# Patient Record
Sex: Female | Born: 2000 | Race: Black or African American | Hispanic: No | Marital: Single | Smoking: Never smoker
Health system: Southern US, Community
[De-identification: ages and names within clinical notes are randomized; demographics above are authoritative.]

## PROBLEM LIST (undated history)

## (undated) DIAGNOSIS — L509 Urticaria, unspecified: Secondary | ICD-10-CM

## (undated) DIAGNOSIS — L309 Dermatitis, unspecified: Secondary | ICD-10-CM

## (undated) DIAGNOSIS — R0602 Shortness of breath: Secondary | ICD-10-CM

## (undated) DIAGNOSIS — J45909 Unspecified asthma, uncomplicated: Secondary | ICD-10-CM

## (undated) HISTORY — DX: Shortness of breath: R06.02

## (undated) HISTORY — DX: Dermatitis, unspecified: L30.9

## (undated) HISTORY — DX: Urticaria, unspecified: L50.9

---
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
## 2019-01-15 ENCOUNTER — Other Ambulatory Visit: Payer: Self-pay

## 2019-01-15 ENCOUNTER — Encounter: Payer: Self-pay | Admitting: Allergy

## 2019-01-15 ENCOUNTER — Ambulatory Visit (INDEPENDENT_AMBULATORY_CARE_PROVIDER_SITE_OTHER): Payer: Medicaid Other | Admitting: Allergy

## 2019-01-15 VITALS — BP 127/78 | HR 94 | Temp 98.5°F | Resp 18 | Ht 64.0 in | Wt 309.6 lb

## 2019-01-15 DIAGNOSIS — J3089 Other allergic rhinitis: Secondary | ICD-10-CM | POA: Diagnosis not present

## 2019-01-15 DIAGNOSIS — T7800XD Anaphylactic reaction due to unspecified food, subsequent encounter: Secondary | ICD-10-CM | POA: Diagnosis not present

## 2019-01-15 DIAGNOSIS — J4520 Mild intermittent asthma, uncomplicated: Secondary | ICD-10-CM | POA: Diagnosis not present

## 2019-01-15 MED ORDER — MONTELUKAST SODIUM 10 MG PO TABS: 10.0000 mg | ORAL_TABLET | Freq: Every day | ORAL | 5 refills | Status: DC

## 2019-01-15 MED ORDER — ALBUTEROL SULFATE (2.5 MG/3ML) 0.083% IN NEBU: 2.5000 mg | INHALATION_SOLUTION | RESPIRATORY_TRACT | 1 refills | Status: DC | PRN

## 2019-01-15 MED FILL — ALBUTEROL 0.083 MG/ML SOLN: (2.5 MG/3ML | 10 days supply | Qty: 90 | Fill #0

## 2019-01-15 NOTE — Patient Instructions (Addendum)
Food allergy    - continue avoidance of fish    - have access to self-injectable epinephrine (Epipen or AuviQ) 0.3mg  at all times    - follow emergency action plan in case of allergic reaction  Perennial allergic rhinitis  Continue allergen avoidance measures and continue to wear mask while during yardwork  Take Allegra 180mg  daily (this replaces claritin)  If you have any nasal congestion or drainage use Flonase, Nasacort or Rhinocort 2 sprays each nostril daily for 1-2 weeks at a time  Continue use of nasal saline spray to help clean out the nose and keep moisturized  Consider allergen immunotherapy (allergy shots) if medication management is not effective in controlling allergy symptoms  Mild intermittent asthma - have access to albuterol inhaler 2 puffs every 4-6 hours as needed for cough/wheeze/shortness of breath/chest tightness.  May use 15-20 minutes prior to activity.   Monitor frequency of use.   - if you are not meeting below goals then start Singulair 10mg  daily in evenings  Asthma control goals:   Full participation in all desired activities (may need albuterol before activity)  Albuterol use two time or less a week on average (not counting use with activity)  Cough interfering with sleep two time or less a month  Oral steroids no more than once a year  No hospitalizations  Follow-up 4-6 months or sooner if needed

## 2019-01-15 NOTE — Progress Notes (Signed)
Follow-up Note  RE: SIBYLLA SOLEM MRN: [id number] DOB: 03-Mar-2001 Date of Office Visit: 01/15/2019   History of present illness: Jeanette Hernandez is a 18 y.o. female presenting today recent allergy symptom flare.  She has a history of allergic rhinitis, mild intermittent asthma and food allergy.  She was last seen in the office on November 05, 2017 by Dr. Nunzio Cobbs.  She states about a week ago she was working outside with her mother for about 20 to 30 minutes and was wearing a facemask and after she was done with the yard work she developed symptoms of coughing and sneezing.  She states the next day she had shortness of breath and was hoarse.  She states she has been taking a generic Claritin at night and then using Alka-Seltzer during the day.  She states she did use her albuterol inhaler 3 days ago with relief of symptoms.  She denies any nighttime awakenings.  She states the last time she needed to use her albuterol was about a year or so ago.  She states her allergy symptoms were not this bad last spring.  She states she used to take Singulair but she has been off of it since she was in middle school or late high school and she states she stopped as she ran out and never got the medication refilled again. She continues to avoid fish and has access to an epinephrine device.  Review of systems: Review of Systems  Constitutional: Negative for chills, fever and malaise/fatigue.  HENT: Negative for congestion, ear discharge, ear pain, nosebleeds, sinus pain and sore throat.   Eyes: Negative for pain, discharge and redness.  Respiratory: Positive for cough and shortness of breath. Negative for sputum production and wheezing.   Cardiovascular: Negative for chest pain.  Gastrointestinal: Negative for abdominal pain, constipation, diarrhea, heartburn, nausea and vomiting.  Musculoskeletal: Negative for joint pain.  Skin: Negative for itching and rash.  Neurological: Negative for headaches.     All other systems negative unless noted above in HPI  Past medical/social/surgical/family history have been reviewed and are unchanged unless specifically indicated below.  No changes  Medication List: Allergies as of 01/15/2019      Reactions   Fish Allergy Anaphylaxis   Amoxicillin       Medication List       Accurate as of January 15, 2019  3:47 PM. Always use your most recent med list.        albuterol 108 (90 Base) MCG/ACT inhaler Commonly known as:  ProAir HFA Inhale 2 puffs into the lungs every 6 (six) hours as needed for wheezing or shortness of breath.   albuterol (2.5 MG/3ML) 0.083% nebulizer solution Commonly known as:  PROVENTIL Take 3 mLs (2.5 mg total) by nebulization every 4 (four) hours as needed for wheezing or shortness of breath.   [facility] Allergy 25 mg capsule Generic drug:  diphenhydrAMINE TAKE 1 CAPSULE BY MOUTH EVERY 6 HOURS FOR 4 DAYS   EPINEPHrine 0.3 mg/0.3 mL Soaj injection Commonly known as:  EpiPen 2-Pak Inject 0.3 mLs (0.3 mg total) into the muscle once.   EQUATE PO Take by mouth.   famotidine 20 MG tablet Commonly known as:  PEPCID Take 1 tablet (20 mg total) by mouth 2 (two) times daily for 4 days.   fexofenadine 180 MG tablet Commonly known as:  ALLEGRA Take 180 mg by mouth every other day.   fluticasone 50 MCG/ACT nasal spray Commonly known as:  Flonase One  spray per nostril 1-2 times daily as needed   ibuprofen 800 MG tablet Commonly known as:  ADVIL,MOTRIN Take 1 tablet (800 mg total) by mouth 3 (three) times daily.   levocetirizine 5 MG tablet Commonly known as:  XYZAL Take 1 tablet (5 mg total) by mouth every evening.   mometasone 50 MCG/ACT nasal spray Commonly known as:  NASONEX Place 2 sprays into the nose daily.   montelukast 10 MG tablet Commonly known as:  SINGULAIR Take 1 tablet (10 mg total) by mouth at bedtime.   MULTIVITAMIN PO Take by mouth.       Known medication allergies: Allergies  Allergen  Reactions  . Fish Allergy Anaphylaxis  . Amoxicillin      Physical examination: Blood pressure 127/78, pulse 94, temperature 98.5 F (36.9 C), temperature source Tympanic, resp. rate 18, height 5\' 4"  (1.626 m), weight (!) 309 lb 9.6 oz (140.4 kg), SpO2 98 %.  General: Alert, interactive, in no acute distress. HEENT: PERRLA, TMs pearly gray, turbinates mildly edematous without discharge, post-pharynx non erythematous. Neck: Supple without lymphadenopathy. Lungs: Clear to auscultation without wheezing, rhonchi or rales. {no increased work of breathing. CV: Normal S1, S2 without murmurs. Abdomen: Nondistended, nontender. Skin: Warm and dry, without lesions or rashes. Extremities:  No clubbing, cyanosis or edema. Neuro:   Grossly intact.  Diagnositics/Labs: None today  Assessment and plan:   Food allergy    - continue avoidance of fish    - have access to self-injectable epinephrine (Epipen or AuviQ) 0.3mg  at all times    - follow emergency action plan in case of allergic reaction  Perennial allergic rhinitis  Continue allergen avoidance measures and continue to wear mask while during yardwork  Take Allegra 180mg  daily (this replaces claritin)  If you have any nasal congestion or drainage use Flonase, Nasacort or Rhinocort 2 sprays each nostril daily for 1-2 weeks at a time  Continue use of nasal saline spray to help clean out the nose and keep moisturized  Consider allergen immunotherapy (allergy shots) if medication management is not effective in controlling allergy symptoms  Mild intermittent asthma - have access to albuterol inhaler 2 puffs every 4-6 hours as needed for cough/wheeze/shortness of breath/chest tightness.  May use 15-20 minutes prior to activity.   Monitor frequency of use.   - if you are not meeting below goals then start Singulair 10mg  daily in evenings  Asthma control goals:   Full participation in all desired activities (may need albuterol before  activity)  Albuterol use two time or less a week on average (not counting use with activity)  Cough interfering with sleep two time or less a month  Oral steroids no more than once a year  No hospitalizations  Follow-up 4-6 months or sooner if needed I appreciate the opportunity to take part in [place]'s care. Please do not hesitate to contact me with questions.  Sincerely,   Margo AyeShaylar Padgett, MD Allergy/Immunology [facility]

## 2019-07-17 ENCOUNTER — Ambulatory Visit: Payer: Medicaid Other | Admitting: Allergy

## 2020-01-08 ENCOUNTER — Ambulatory Visit: Payer: Medicaid Other | Attending: Internal Medicine

## 2020-01-08 DIAGNOSIS — Z23 Encounter for immunization: Secondary | ICD-10-CM

## 2020-01-08 NOTE — Progress Notes (Signed)
   Covid-19 Vaccination Clinic  Name:  Jeanette Hernandez    MRN: [id number] DOB: Sep 12, 2001  01/08/2020  Jeanette Hernandez was observed post Covid-19 immunization for 15 minutes without incident. She was provided with Vaccine Information Sheet and instruction to access the V-Safe system.   Jeanette Hernandez was instructed to call 911 with any severe reactions post vaccine: Marland Kitchen Difficulty breathing  . Swelling of face and throat  . A fast heartbeat  . A bad rash all over body  . Dizziness and weakness   Immunizations Administered    Name Date Dose VIS Date Route   Pfizer COVID-19 Vaccine 01/08/2020  3:09 PM 0.3 mL 09/18/2019 Intramuscular   Manufacturer: [employer], [employer]   Lot: [id number]   NDC: [address]-9

## 2020-02-02 ENCOUNTER — Ambulatory Visit: Payer: Medicaid Other | Attending: Internal Medicine

## 2020-02-02 DIAGNOSIS — Z23 Encounter for immunization: Secondary | ICD-10-CM

## 2020-02-02 NOTE — Progress Notes (Signed)
   Covid-19 Vaccination Clinic  Name:  Jeanette Hernandez    MRN: [id number] DOB: 15-May-2001  02/02/2020  [name] was observed post Covid-19 immunization for 30 minutes based on pre-vaccination screening without incident. She was provided with Vaccine Information Sheet and instruction to access the V-Safe system.   [name] was instructed to call 911 with any severe reactions post vaccine: Marland Kitchen Difficulty breathing  . Swelling of face and throat  . A fast heartbeat  . A bad rash all over body  . Dizziness and weakness   Immunizations Administered    Name Date Dose VIS Date Route   Pfizer COVID-19 Vaccine 02/02/2020  4:38 PM 0.3 mL 12/02/2018 Intramuscular   Manufacturer: [employer], [employer]   Lot: [id number]   NDC: [address]-9

## 2020-02-03 ENCOUNTER — Encounter: Payer: Self-pay | Admitting: Allergy

## 2020-02-03 ENCOUNTER — Other Ambulatory Visit: Payer: Self-pay

## 2020-02-03 ENCOUNTER — Ambulatory Visit (INDEPENDENT_AMBULATORY_CARE_PROVIDER_SITE_OTHER): Payer: Medicaid Other | Admitting: Allergy

## 2020-02-03 VITALS — BP 122/76 | HR 113 | Temp 98.1°F | Resp 18 | Ht 64.0 in

## 2020-02-03 DIAGNOSIS — J3089 Other allergic rhinitis: Secondary | ICD-10-CM | POA: Diagnosis not present

## 2020-02-03 DIAGNOSIS — J4520 Mild intermittent asthma, uncomplicated: Secondary | ICD-10-CM | POA: Diagnosis not present

## 2020-02-03 DIAGNOSIS — T7800XD Anaphylactic reaction due to unspecified food, subsequent encounter: Secondary | ICD-10-CM | POA: Diagnosis not present

## 2020-02-03 MED ORDER — MONTELUKAST SODIUM 10 MG PO TABS: 10.0000 mg | ORAL_TABLET | Freq: Every day | ORAL | 5 refills | Status: DC

## 2020-02-03 MED ORDER — TRIAMCINOLONE ACETONIDE 55 MCG/ACT NA AERO: 2.0000 | INHALATION_SPRAY | Freq: Every day | NASAL | 5 refills | Status: DC

## 2020-02-03 MED ORDER — FEXOFENADINE HCL 180 MG PO TABS: 180.0000 mg | ORAL_TABLET | Freq: Every day | ORAL | 5 refills | Status: DC

## 2020-02-03 MED FILL — MONTELUKAST SOD 10 MG TAB: 10 | 30 days supply | Qty: 30 | Fill #0

## 2020-02-03 NOTE — Progress Notes (Signed)
Follow-up Note  RE: Jeanette Hernandez MRN: [id number] DOB: 2001/02/27 Date of Office Visit: 02/03/2020   History of present illness: Jeanette Hernandez is a 19 y.o. female presenting today for follow-up of food allergy, allergic rhinitis and asthma.  She was last seen in the office on January 15, 2019 by myself.  She states over the past year she has done well without any major health changes, surgeries, hospitalizations or Covid illness. She states with the onset of tree pollen season she has been waking up with more nasal congestion and needing to clear her throat as well as some chest tightness.  She states she has been taking Alka-Seltzer tabs in the morning as well as her morning vitamins.  She may take a Claritin in the day if needed.  She will normally take her Allegra which is her main antihistamine at night.  She states she has not used any nasal sprays at this time. With the chest tightness that she is feeling she states she has not needed to use her albuterol every time.  She states over the past year she has used albuterol maybe 2-3 times.  She is currently not taking Singulair.  She denies any nighttime awakenings.  She has not required any ED or urgent care visits or any systemic steroid needs. She continues the avoidance of fish and has not needed to use her epinephrine device.  Review of systems: Review of Systems  Constitutional: Negative.   HENT:       See HPI  Eyes: Negative.   Respiratory:       See HPI  Cardiovascular: Negative.   Gastrointestinal: Negative.   Musculoskeletal: Negative.   Skin: Negative.   Neurological: Negative.     All other systems negative unless noted above in HPI  Past medical/social/surgical/family history have been reviewed and are unchanged unless specifically indicated below.  No changes  Medication List: Current Outpatient Medications  Medication Sig Dispense Refill  . albuterol (PROAIR HFA) 108 (90 Base) MCG/ACT inhaler Inhale 2 puffs  into the lungs every 6 (six) hours as needed for wheezing or shortness of breath. 1 Inhaler 1  . albuterol (PROVENTIL) (2.5 MG/3ML) 0.083% nebulizer solution Take 3 mLs (2.5 mg total) by nebulization every 4 (four) hours as needed for wheezing or shortness of breath. 75 mL 1  . [facility] ALLERGY 25 MG capsule TAKE 1 CAPSULE BY MOUTH EVERY 6 HOURS FOR 4 DAYS  0  . EPINEPHrine (EPIPEN 2-PAK) 0.3 mg/0.3 mL IJ SOAJ injection Inject 0.3 mLs (0.3 mg total) into the muscle once. 4 Device 1  . fexofenadine (ALLEGRA) 180 MG tablet Take 1 tablet (180 mg total) by mouth daily. 30 tablet 5  . montelukast (SINGULAIR) 10 MG tablet Take 1 tablet (10 mg total) by mouth at bedtime. 30 tablet 5  . Multiple Vitamins-Minerals (MULTIVITAMIN PO) Take by mouth.    . Nutritional Supplements (EQUATE PO) Take by mouth.    . famotidine (PEPCID) 20 MG tablet Take 1 tablet (20 mg total) by mouth 2 (two) times daily for 4 days. 8 tablet 0  . fluticasone (FLONASE) 50 MCG/ACT nasal spray One spray per nostril 1-2 times daily as needed (Patient not taking: Reported on 01/15/2019) 16 g 5  . ibuprofen (ADVIL,MOTRIN) 800 MG tablet Take 1 tablet (800 mg total) by mouth 3 (three) times daily. (Patient not taking: Reported on 02/03/2020) 21 tablet 0  . levocetirizine (XYZAL) 5 MG tablet Take 1 tablet (5 mg total) by mouth  every evening. (Patient not taking: Reported on 01/15/2019) 30 tablet 5  . mometasone (NASONEX) 50 MCG/ACT nasal spray Place 2 sprays into the nose daily. (Patient not taking: Reported on 11/05/2017) 17 g 5  . triamcinolone (NASACORT) 55 MCG/ACT AERO nasal inhaler Place 2 sprays into the nose daily. 1 Inhaler 5   No current facility-administered medications for this visit.     Known medication allergies: Allergies  Allergen Reactions  . Fish Allergy Anaphylaxis  . Amoxicillin      Physical examination: Blood pressure 122/76, pulse (!) 113, temperature 98.1 F (36.7 C), temperature source Temporal, resp. rate 18, height  5\' 4"  (1.626 m), SpO2 97 %.  General: Alert, interactive, in no acute distress. HEENT: PERRLA, TMs pearly gray, turbinates moderately edematous without discharge, post-pharynx non erythematous. Neck: Supple without lymphadenopathy. Lungs: Clear to auscultation without wheezing, rhonchi or rales. {no increased work of breathing. CV: Normal S1, S2 without murmurs. Abdomen: Nondistended, nontender. Skin: Warm and dry, without lesions or rashes. Extremities:  No clubbing, cyanosis or edema. Neuro:   Grossly intact.  Diagnositics/Labs:  Spirometry: FEV1: 2.94L 100%, FVC: 3.63L 110%, ratio consistent with Nonobstructive pattern  Assessment and plan: Anaphylaxis due to food    - continue avoidance of fish    - have access to self-injectable epinephrine (Epipen or AuviQ) 0.3mg  at all times    - follow emergency action plan in case of allergic reaction  Allergic rhinitis  Continue allergen avoidance measures and continue to wear mask while during yardwork  Continue Allegra 180mg  daily (this replaces claritin)  Start Singulair 10mg  daily at bedtime with Allegra  For nasal congestion or drainage use Flonase, Nasacort or Rhinocort 2 sprays each nostril daily for 1-2 weeks at a time  Continue use of nasal saline spray to help clean out the nose and keep moisturized  Consider allergen immunotherapy (allergy shots) if medication management is not effective in controlling allergy symptoms  Mild intermittent asthma - have access to albuterol inhaler 2 puffs every 4-6 hours as needed for cough/wheeze/shortness of breath/chest tightness.  May use 15-20 minutes prior to activity.   Monitor frequency of use.   - Singulair will help with asthma control as well  Asthma control goals:   Full participation in all desired activities (may need albuterol before activity)  Albuterol use two time or less a week on average (not counting use with activity)  Cough interfering with sleep two time or less  a month  Oral steroids no more than once a year  No hospitalizations  Follow-up 6 months or sooner if needed  I appreciate the opportunity to take part in [facility] care. Please do not hesitate to contact me with questions.  Sincerely,   Prudy Feeler, MD Allergy/Immunology [facility] [place] [facility]

## 2020-02-03 NOTE — Patient Instructions (Addendum)
Food allergy    - continue avoidance of fish    - have access to self-injectable epinephrine (Epipen or AuviQ) 0.3mg  at all times    - follow emergency action plan in case of allergic reaction  Perennial allergic rhinitis  Continue allergen avoidance measures and continue to wear mask while during yardwork  Continue Allegra 180mg  daily (this replaces claritin)  Start Singulair 10mg  daily at bedtime with Allegra  For nasal congestion or drainage use Flonase, Nasacort or Rhinocort 2 sprays each nostril daily for 1-2 weeks at a time  Continue use of nasal saline spray to help clean out the nose and keep moisturized  Consider allergen immunotherapy (allergy shots) if medication management is not effective in controlling allergy symptoms  Mild intermittent asthma - have access to albuterol inhaler 2 puffs every 4-6 hours as needed for cough/wheeze/shortness of breath/chest tightness.  May use 15-20 minutes prior to activity.   Monitor frequency of use.   - Singulair will help with asthma control as well  Asthma control goals:   Full participation in all desired activities (may need albuterol before activity)  Albuterol use two time or less a week on average (not counting use with activity)  Cough interfering with sleep two time or less a month  Oral steroids no more than once a year  No hospitalizations  Follow-up 6 months or sooner if needed

## 2020-02-24 DIAGNOSIS — H5203 Hypermetropia, bilateral: Secondary | ICD-10-CM | POA: Diagnosis not present

## 2020-02-24 DIAGNOSIS — H1045 Other chronic allergic conjunctivitis: Secondary | ICD-10-CM | POA: Diagnosis not present

## 2020-02-24 DIAGNOSIS — H52223 Regular astigmatism, bilateral: Secondary | ICD-10-CM | POA: Diagnosis not present

## 2020-02-25 DIAGNOSIS — H5213 Myopia, bilateral: Secondary | ICD-10-CM | POA: Diagnosis not present

## 2020-04-07 DIAGNOSIS — Z419 Encounter for procedure for purposes other than remedying health state, unspecified: Secondary | ICD-10-CM | POA: Diagnosis not present

## 2020-04-08 DIAGNOSIS — H52223 Regular astigmatism, bilateral: Secondary | ICD-10-CM | POA: Diagnosis not present

## 2020-04-08 DIAGNOSIS — H5203 Hypermetropia, bilateral: Secondary | ICD-10-CM | POA: Diagnosis not present

## 2020-05-08 DIAGNOSIS — Z419 Encounter for procedure for purposes other than remedying health state, unspecified: Secondary | ICD-10-CM | POA: Diagnosis not present

## 2020-05-31 ENCOUNTER — Other Ambulatory Visit: Payer: Self-pay

## 2020-05-31 ENCOUNTER — Ambulatory Visit (HOSPITAL_COMMUNITY)
Admission: EM | Admit: 2020-05-31 | Discharge: 2020-05-31 | Disposition: A | Discharge status: Home / Self Care | Payer: Medicaid Other

## 2020-05-31 ENCOUNTER — Encounter (HOSPITAL_COMMUNITY): Payer: Self-pay | Admitting: Emergency Medicine

## 2020-05-31 DIAGNOSIS — M67431 Ganglion, right wrist: Secondary | ICD-10-CM | POA: Diagnosis not present

## 2020-05-31 NOTE — ED Triage Notes (Signed)
Pt c/o cyst on her right wrist that has progressively gotten worse. She states yesterday she had some numbness in her hand.

## 2020-05-31 NOTE — Discharge Instructions (Signed)
Use your braces  Call the orthopedic office tomorrow for follow up

## 2020-05-31 NOTE — ED Provider Notes (Signed)
[facility]    CSN: [id number] Arrival date & time: 05/31/20  1649      History   Chief Complaint Chief Complaint  Patient presents with  . Cyst    HPI Jeanette Hernandez is a 19 y.o. female.   She presents for cyst of the back of her right wrist.  She reports she had a similar cyst several years ago the underside of her wrist.  She reports this cyst started in May or June.  It is grown slightly since then.  Has become more painful.  Pain is primarily with movements and repetitive use of the wrist.  She reports occasional numbness shooting in her hand.  She has been trying braces without much luck.  She had seen a hand specialist for previous cyst and avoided surgery with splinting before.     Past Medical History:  Diagnosis Date  . Asthma   . Eczema    as a child  . Urticaria     Patient Active Problem List   Diagnosis Date Noted  . Allergic reaction 11/05/2017  . Food allergy 11/05/2017  . Perennial allergic rhinitis 11/05/2017  . Mild intermittent asthma 11/05/2017  . Allergic urticaria 11/05/2017    History reviewed. No pertinent surgical history.  OB History   No obstetric history on file.      Home Medications    Prior to Admission medications   Medication Sig Start Date End Date Taking? Authorizing Provider  albuterol (PROAIR HFA) 108 (90 Base) MCG/ACT inhaler Inhale 2 puffs into the lungs every 6 (six) hours as needed for wheezing or shortness of breath. 11/14/17  Yes Bobbitt, Heywood Iles, MD  albuterol (PROVENTIL) (2.5 MG/3ML) 0.083% nebulizer solution Take 3 mLs (2.5 mg total) by nebulization every 4 (four) hours as needed for wheezing or shortness of breath. 01/15/19  Yes Padgett, Pilar Grammes, MD  [facility] ALLERGY 25 MG capsule TAKE 1 CAPSULE BY MOUTH EVERY 6 HOURS FOR 4 DAYS 10/26/17  Yes [provider]  EPINEPHrine (EPIPEN 2-PAK) 0.3 mg/0.3 mL IJ SOAJ injection Inject 0.3 mLs (0.3 mg total) into the muscle once. 07/22/15  Yes Baxter Hire, MD  fexofenadine (ALLEGRA) 180 MG tablet Take 1 tablet (180 mg total) by mouth daily. 02/03/20  Yes Padgett, Pilar Grammes, MD  montelukast (SINGULAIR) 10 MG tablet Take 1 tablet (10 mg total) by mouth at bedtime. 02/03/20  Yes Padgett, Pilar Grammes, MD  Multiple Vitamins-Minerals (MULTIVITAMIN PO) Take by mouth.   Yes [provider]  Nutritional Supplements (EQUATE PO) Take by mouth.   Yes [provider]  triamcinolone (NASACORT) 55 MCG/ACT AERO nasal inhaler Place 2 sprays into the nose daily. 02/03/20  Yes Padgett, Pilar Grammes, MD  famotidine (PEPCID) 20 MG tablet Take 1 tablet (20 mg total) by mouth 2 (two) times daily for 4 days. 10/26/17 10/30/17  Maxwell Caul, PA-C  fluticasone (FLONASE) 50 MCG/ACT nasal spray One spray per nostril 1-2 times daily as needed Patient not taking: Reported on 01/15/2019 11/05/17   Bobbitt, Heywood Iles, MD  ibuprofen (ADVIL,MOTRIN) 800 MG tablet Take 1 tablet (800 mg total) by mouth 3 (three) times daily. Patient not taking: Reported on 02/03/2020 09/19/17   Roxy Horseman, PA-C  levocetirizine (XYZAL) 5 MG tablet Take 1 tablet (5 mg total) by mouth every evening. Patient not taking: Reported on 01/15/2019 11/05/17   Bobbitt, Heywood Iles, MD  mometasone (NASONEX) 50 MCG/ACT nasal spray Place 2 sprays into the nose daily. Patient not  taking: Reported on 11/05/2017 07/22/15   Baxter Hire, MD    Family History Family History  Problem Relation Age of Onset  . Asthma Father     Social History Social History   Tobacco Use  . Smoking status: Passive Smoke Exposure - Never Smoker  . Smokeless tobacco: Never Used  Vaping Use  . Vaping Use: Never used  Substance Use Topics  . Alcohol use: No    Alcohol/week: 0.0 standard drinks  . Drug use: No     Allergies   Fish allergy and Amoxicillin   Review of Systems Review of Systems   Physical Exam Triage Vital Signs ED Triage Vitals [05/31/20 1747]  Enc  Vitals Group     BP      Pulse      Resp      Temp      Temp src      SpO2      Weight      Height      Head Circumference      Peak Flow      Pain Score 8     Pain Loc      Pain Edu?      Excl. in GC?    No data found.  Updated Vital Signs Pulse 90   Temp 98.2 F (36.8 C) (Oral)   Resp 16   LMP 05/22/2020   SpO2 100%   Visual Acuity Right Eye Distance:   Left Eye Distance:   Bilateral Distance:    Right Eye Near:   Left Eye Near:    Bilateral Near:     Physical Exam Vitals and nursing note reviewed.  Constitutional:      Appearance: Normal appearance.  Musculoskeletal:     Comments: Right wrist with small roughly 0.75 cm cyst on the dorsal aspect of the wrist.  No overlying erythema.  Neurological:     Mental Status: She is alert.      UC Treatments / Results  Labs (all labs ordered are listed, but only abnormal results are displayed) Labs Reviewed - No data to display  EKG   Radiology No results found.  Procedures Procedures (including critical care time)  Medications Ordered in UC Medications - No data to display  Initial Impression / Assessment and Plan / UC Course  I have reviewed the triage vital signs and the nursing notes.  Pertinent labs & imaging results that were available during my care of the patient were reviewed by me and considered in my medical decision making (see chart for details).     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  Patient has braces at home, will recommend continued use of this.  We will have her follow-up with hand specialist for consultation for possible removal and further management.  Patient verbalized agreement understanding plan of care. 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   Hermelinda Medicus, PA-C 06/01/20 [phone]

## 2020-06-08 DIAGNOSIS — Z419 Encounter for procedure for purposes other than remedying health state, unspecified: Secondary | ICD-10-CM | POA: Diagnosis not present

## 2020-06-28 DIAGNOSIS — M67431 Ganglion, right wrist: Secondary | ICD-10-CM | POA: Diagnosis not present

## 2020-07-08 DIAGNOSIS — Z419 Encounter for procedure for purposes other than remedying health state, unspecified: Secondary | ICD-10-CM | POA: Diagnosis not present

## 2020-07-13 ENCOUNTER — Other Ambulatory Visit: Payer: Self-pay | Admitting: Family Medicine

## 2020-07-13 ENCOUNTER — Encounter: Payer: Self-pay | Admitting: Family Medicine

## 2020-07-13 ENCOUNTER — Ambulatory Visit: Payer: Medicaid Other | Attending: Family Medicine | Admitting: Family Medicine

## 2020-07-13 ENCOUNTER — Other Ambulatory Visit: Payer: Self-pay

## 2020-07-13 VITALS — BP 115/70 | HR 84 | Ht 64.0 in | Wt 311.0 lb

## 2020-07-13 DIAGNOSIS — Z23 Encounter for immunization: Secondary | ICD-10-CM | POA: Diagnosis not present

## 2020-07-13 DIAGNOSIS — N946 Dysmenorrhea, unspecified: Secondary | ICD-10-CM

## 2020-07-13 DIAGNOSIS — Z13228 Encounter for screening for other metabolic disorders: Secondary | ICD-10-CM | POA: Diagnosis not present

## 2020-07-13 MED ORDER — NORETHIN ACE-ETH ESTRAD-FE 1.5-30 MG-MCG PO TABS: 1.0000 | ORAL_TABLET | Freq: Every day | ORAL | 11 refills | Status: DC

## 2020-07-13 MED FILL — JUNEL FE 1.5/30 TABLET: 1.5-30 | 28 days supply | Qty: 28 | Fill #0

## 2020-07-13 NOTE — Progress Notes (Signed)
Has been having heavy period, states that she is having heavy bleeding. Having very bad cramps.  Lab work shows she is anemic and she says its due to her heavy bleeding.

## 2020-07-13 NOTE — Progress Notes (Signed)
Subjective:  Patient ID: MCKYNLIE VANDERSLICE, female    DOB: 2001/08/10  Age: 19 y.o. MRN: [id number]  CC: New Patient (Initial Visit)   HPI Jeanette Hernandez is a 19 year old female who presents to establish care.  Her Mom is on the phone during the visit. She has had heavy bleeding for the last 1 to 2 years. Attained menarche at 12 cycles have been regular. This is associated with abdominal cramping and clots. She uses Tylenol and Pamprin for her symptoms. Mom provides this information about the patient's recent labs-TSH was normal. Hgb was 11.6 from an outside lab. LMP 06/22/20 and periods last about 4 days.  Past Medical History:  Diagnosis Date  . Asthma   . Eczema    as a child  . Urticaria     No past surgical history on file.  Family History  Problem Relation Age of Onset  . Asthma Father     Allergies  Allergen Reactions  . Fish Allergy Anaphylaxis  . Amoxicillin     Outpatient Medications Prior to Visit  Medication Sig Dispense Refill  . albuterol (PROAIR HFA) 108 (90 Base) MCG/ACT inhaler Inhale 2 puffs into the lungs every 6 (six) hours as needed for wheezing or shortness of breath. 1 Inhaler 1  . albuterol (PROVENTIL) (2.5 MG/3ML) 0.083% nebulizer solution Take 3 mLs (2.5 mg total) by nebulization every 4 (four) hours as needed for wheezing or shortness of breath. 75 mL 1  . [facility] ALLERGY 25 MG capsule TAKE 1 CAPSULE BY MOUTH EVERY 6 HOURS FOR 4 DAYS  0  . EPINEPHrine (EPIPEN 2-PAK) 0.3 mg/0.3 mL IJ SOAJ injection Inject 0.3 mLs (0.3 mg total) into the muscle once. 4 Device 1  . montelukast (SINGULAIR) 10 MG tablet Take 1 tablet (10 mg total) by mouth at bedtime. 30 tablet 5  . Multiple Vitamins-Minerals (MULTIVITAMIN PO) Take by mouth.    . famotidine (PEPCID) 20 MG tablet Take 1 tablet (20 mg total) by mouth 2 (two) times daily for 4 days. 8 tablet 0  . fexofenadine (ALLEGRA) 180 MG tablet Take 1 tablet (180 mg total) by mouth daily. 30 tablet 5  . fluticasone  (FLONASE) 50 MCG/ACT nasal spray One spray per nostril 1-2 times daily as needed (Patient not taking: Reported on 01/15/2019) 16 g 5  . ibuprofen (ADVIL,MOTRIN) 800 MG tablet Take 1 tablet (800 mg total) by mouth 3 (three) times daily. (Patient not taking: Reported on 02/03/2020) 21 tablet 0  . levocetirizine (XYZAL) 5 MG tablet Take 1 tablet (5 mg total) by mouth every evening. (Patient not taking: Reported on 01/15/2019) 30 tablet 5  . mometasone (NASONEX) 50 MCG/ACT nasal spray Place 2 sprays into the nose daily. (Patient not taking: Reported on 11/05/2017) 17 g 5  . Nutritional Supplements (EQUATE PO) Take by mouth. (Patient not taking: Reported on 07/13/2020)    . triamcinolone (NASACORT) 55 MCG/ACT AERO nasal inhaler Place 2 sprays into the nose daily. (Patient not taking: Reported on 07/13/2020) 1 Inhaler 5   No facility-administered medications prior to visit.     ROS Review of Systems  Constitutional: Negative for activity change, appetite change and fatigue.  HENT: Negative for congestion, sinus pressure and sore throat.   Eyes: Negative for visual disturbance.  Respiratory: Negative for cough, chest tightness, shortness of breath and wheezing.   Cardiovascular: Negative for chest pain and palpitations.  Gastrointestinal: Negative for abdominal distention, abdominal pain and constipation.  Endocrine: Negative for polydipsia.  Genitourinary: Positive  for menstrual problem. Negative for dysuria and frequency.  Musculoskeletal: Negative for arthralgias and back pain.  Skin: Negative for rash.  Neurological: Negative for tremors, light-headedness and numbness.  Hematological: Does not bruise/bleed easily.  Psychiatric/Behavioral: Negative for agitation and behavioral problems.    Objective:  BP 115/70   Pulse 84   Ht 5\' 4"  (1.626 m)   Wt (!) 311 lb (141.1 kg)   SpO2 100%   BMI 53.38 kg/m   BP/Weight 07/13/2020 02/03/2020 01/15/2019  Systolic BP 115 122 127  Diastolic BP 70 76 78    Wt. (Lbs) 311 - 309.6  BMI 53.38 53.14 53.14      Physical Exam Constitutional:      Appearance: She is well-developed. She is obese.  Neck:     Vascular: No JVD.  Cardiovascular:     Rate and Rhythm: Normal rate.     Heart sounds: Normal heart sounds. No murmur heard.   Pulmonary:     Effort: Pulmonary effort is normal.     Breath sounds: Normal breath sounds. No wheezing or rales.  Chest:     Chest wall: No tenderness.  Abdominal:     General: Bowel sounds are normal. There is no distension.     Palpations: Abdomen is soft. There is no mass.     Tenderness: There is no abdominal tenderness.  Musculoskeletal:        General: Normal range of motion.     Right lower leg: No edema.     Left lower leg: No edema.  Neurological:     Mental Status: She is alert and oriented to person, place, and time.  Psychiatric:        Mood and Affect: Mood normal.     CMP Latest Ref Rng & Units 09/18/2017  Glucose 65 - 99 mg/dL 14/09/2017)  BUN 6 - 20 mg/dL 8  Creatinine 509(T - 2.67 mg/dL 1.24  Sodium 5.80 - 998 mmol/L 135  Potassium 3.5 - 5.1 mmol/L 3.2(L)  Chloride 101 - 111 mmol/L 104  CO2 22 - 32 mmol/L 25  Calcium 8.9 - 10.3 mg/dL 338)    Lipid Panel  No results found for: CHOL, TRIG, HDL, CHOLHDL, VLDL, LDLCALC, LDLDIRECT  CBC    Component Value Date/Time   WBC 10.9 (H) 09/18/2017 2046   RBC 4.56 09/18/2017 2046   HGB 10.5 (L) 09/18/2017 2046   HCT 33.3 (L) 09/18/2017 2046   PLT 349 09/18/2017 2046   MCV 73.0 (L) 09/18/2017 2046   MCH 23.0 (L) 09/18/2017 2046   MCHC 31.5 09/18/2017 2046   RDW 16.7 (H) 09/18/2017 2046    No results found for: HGBA1C  Assessment & Plan:  1. Menorrhalgia We will place on oral contraceptive pill-discussed risks and benefits We will need to exclude anemia and other metabolic causes Pap smear at next visit Consider pelvic ultrasound down the road - CBC with Differential/Platelet - norethindrone-ethinyl estradiol-iron (MICROGESTIN FE  1.5/30) 1.5-30 MG-MCG tablet; Take 1 tablet by mouth daily.  Dispense: 28 tablet; Refill: 11  2. Screening for metabolic disorder - Basic Metabolic Panel - TSH - Hemoglobin A1c    Meds ordered this encounter  Medications  . norethindrone-ethinyl estradiol-iron (MICROGESTIN FE 1.5/30) 1.5-30 MG-MCG tablet    Sig: Take 1 tablet by mouth daily.    Dispense:  28 tablet    Refill:  11    Follow-up: Return in about 6 weeks (around 08/25/2020) for Follow-up of menorrhagia.       Elsbeth Yearick  Alvis Lemmings, MD, FAAFP. [facility] [place], [place] [phone]   07/13/2020, 2:47 PM

## 2020-07-14 LAB — CBC WITH DIFFERENTIAL/PLATELET
Basophils Absolute: 0 10*3/uL (ref 0.0–0.2)
Basos: 0 %
EOS (ABSOLUTE): 0.2 10*3/uL (ref 0.0–0.4)
Eos: 2 %
Hematocrit: 35.3 % (ref 34.0–46.6)
Hemoglobin: 11.3 g/dL (ref 11.1–15.9)
Immature Grans (Abs): 0 10*3/uL (ref 0.0–0.1)
Immature Granulocytes: 0 %
Lymphocytes Absolute: 2.5 10*3/uL (ref 0.7–3.1)
Lymphs: 25 %
MCH: 24.1 pg — ABNORMAL LOW (ref 26.6–33.0)
MCHC: 32 g/dL (ref 31.5–35.7)
MCV: 75 fL — ABNORMAL LOW (ref 79–97)
Monocytes Absolute: 0.6 10*3/uL (ref 0.1–0.9)
Monocytes: 6 %
Neutrophils Absolute: 6.8 10*3/uL (ref 1.4–7.0)
Neutrophils: 67 %
Platelets: 418 10*3/uL (ref 150–450)
RBC: 4.69 x10E6/uL (ref 3.77–5.28)
RDW: 16 % — ABNORMAL HIGH (ref 11.7–15.4)
WBC: 10.1 10*3/uL (ref 3.4–10.8)

## 2020-07-14 LAB — BASIC METABOLIC PANEL
BUN/Creatinine Ratio: 15 (ref 9–23)
BUN: 10 mg/dL (ref 6–20)
CO2: 23 mmol/L (ref 20–29)
Calcium: 8.8 mg/dL (ref 8.7–10.2)
Chloride: 99 mmol/L (ref 96–106)
Creatinine, Ser: 0.65 mg/dL (ref 0.57–1.00)
GFR calc Af Amer: 147 mL/min/{1.73_m2} (ref >59)
GFR calc non Af Amer: 127 mL/min/{1.73_m2} (ref >59)
Glucose: 66 mg/dL (ref 65–99)
Potassium: 3.9 mmol/L (ref 3.5–5.2)
Sodium: 138 mmol/L (ref 134–144)

## 2020-07-14 LAB — HEMOGLOBIN A1C
Est. average glucose Bld gHb Est-mCnc: 111 mg/dL
Hgb A1c MFr Bld: 5.5 % (ref 4.8–5.6)

## 2020-07-14 LAB — TSH: TSH: 1.44 u[IU]/mL (ref 0.450–4.500)

## 2020-07-26 DIAGNOSIS — M67431 Ganglion, right wrist: Secondary | ICD-10-CM | POA: Diagnosis not present

## 2020-08-03 MED FILL — JUNEL FE 1.5/30 TABLET: 1.5-30 | 28 days supply | Qty: 28 | Fill #1

## 2020-08-08 DIAGNOSIS — Z419 Encounter for procedure for purposes other than remedying health state, unspecified: Secondary | ICD-10-CM | POA: Diagnosis not present

## 2020-08-27 MED FILL — JUNEL FE 1.5/30 TABLET: 1.5-30 | 28 days supply | Qty: 28 | Fill #2

## 2020-09-06 ENCOUNTER — Ambulatory Visit: Payer: Medicaid Other | Attending: Family Medicine | Admitting: Family Medicine

## 2020-09-06 ENCOUNTER — Other Ambulatory Visit (HOSPITAL_COMMUNITY)
Admission: RE | Admit: 2020-09-06 | Discharge: 2020-09-06 | Disposition: A | Discharge status: Home / Self Care | Payer: Medicaid Other | Source: Ambulatory Visit | Attending: Family Medicine | Admitting: Family Medicine

## 2020-09-06 ENCOUNTER — Other Ambulatory Visit: Payer: Self-pay

## 2020-09-06 ENCOUNTER — Encounter: Payer: Self-pay | Admitting: Family Medicine

## 2020-09-06 VITALS — BP 126/74 | HR 95 | Ht 64.0 in | Wt 318.0 lb

## 2020-09-06 DIAGNOSIS — Z113 Encounter for screening for infections with a predominantly sexual mode of transmission: Secondary | ICD-10-CM | POA: Diagnosis not present

## 2020-09-06 DIAGNOSIS — Z124 Encounter for screening for malignant neoplasm of cervix: Secondary | ICD-10-CM | POA: Diagnosis not present

## 2020-09-06 DIAGNOSIS — N939 Abnormal uterine and vaginal bleeding, unspecified: Secondary | ICD-10-CM | POA: Diagnosis not present

## 2020-09-06 NOTE — Progress Notes (Signed)
Subjective:  Patient ID: Jeanette Hernandez, female    DOB: [birth year]  Age: 19 y.o. MRN: [id number]  CC: Gynecologic Exam   HPI Jeanette Hernandez is a 19 year old female with a history of abnormal uterine bleed here for follow-up visit and a Pap smear. She started her OCP on 07/17/20 and had a period from 10/17 - 11/12 and then another cycle started 11/22 - 11/26. She has no cramping like she used to with her monthly cycles.  She is tolerating her OCP okay.  Prior to initiating her OCP a course of Provera was not prescribed.  Past Medical History:  Diagnosis Date  . Asthma   . Eczema    as a child  . Urticaria     History reviewed. No pertinent surgical history.  Family History  Problem Relation Age of Onset  . Asthma Father     Allergies  Allergen Reactions  . Fish Allergy Anaphylaxis  . Amoxicillin     Outpatient Medications Prior to Visit  Medication Sig Dispense Refill  . albuterol (PROAIR HFA) 108 (90 Base) MCG/ACT inhaler Inhale 2 puffs into the lungs every 6 (six) hours as needed for wheezing or shortness of breath. 1 Inhaler 1  . albuterol (PROVENTIL) (2.5 MG/3ML) 0.083% nebulizer solution Take 3 mLs (2.5 mg total) by nebulization every 4 (four) hours as needed for wheezing or shortness of breath. 75 mL 1  . [facility] ALLERGY 25 MG capsule TAKE 1 CAPSULE BY MOUTH EVERY 6 HOURS FOR 4 DAYS  0  . EPINEPHrine (EPIPEN 2-PAK) 0.3 mg/0.3 mL IJ SOAJ injection Inject 0.3 mLs (0.3 mg total) into the muscle once. 4 Device 1  . fexofenadine (ALLEGRA) 180 MG tablet Take 1 tablet (180 mg total) by mouth daily. 30 tablet 5  . montelukast (SINGULAIR) 10 MG tablet Take 1 tablet (10 mg total) by mouth at bedtime. 30 tablet 5  . Multiple Vitamins-Minerals (MULTIVITAMIN PO) Take by mouth.    . norethindrone-ethinyl estradiol-iron (MICROGESTIN FE 1.5/30) 1.5-30 MG-MCG tablet Take 1 tablet by mouth daily. 28 tablet 11  . famotidine (PEPCID) 20 MG tablet Take 1 tablet (20 mg total) by mouth 2 (two)  times daily for 4 days. 8 tablet 0  . fluticasone (FLONASE) 50 MCG/ACT nasal spray One spray per nostril 1-2 times daily as needed (Patient not taking: Reported on 01/15/2019) 16 g 5  . ibuprofen (ADVIL,MOTRIN) 800 MG tablet Take 1 tablet (800 mg total) by mouth 3 (three) times daily. (Patient not taking: Reported on 02/03/2020) 21 tablet 0  . levocetirizine (XYZAL) 5 MG tablet Take 1 tablet (5 mg total) by mouth every evening. (Patient not taking: Reported on 01/15/2019) 30 tablet 5  . mometasone (NASONEX) 50 MCG/ACT nasal spray Place 2 sprays into the nose daily. (Patient not taking: Reported on 11/05/2017) 17 g 5  . Nutritional Supplements (EQUATE PO) Take by mouth. (Patient not taking: Reported on 07/13/2020)    . triamcinolone (NASACORT) 55 MCG/ACT AERO nasal inhaler Place 2 sprays into the nose daily. (Patient not taking: Reported on 07/13/2020) 1 Inhaler 5   No facility-administered medications prior to visit.     ROS Review of Systems  Constitutional: Negative for activity change, appetite change and fatigue.  HENT: Negative for congestion, sinus pressure and sore throat.   Eyes: Negative for visual disturbance.  Respiratory: Negative for cough, chest tightness, shortness of breath and wheezing.   Cardiovascular: Negative for chest pain and palpitations.  Gastrointestinal: Negative for abdominal distention, abdominal pain and  constipation.  Endocrine: Negative for polydipsia.  Genitourinary: Positive for menstrual problem. Negative for dysuria and frequency.  Musculoskeletal: Negative for arthralgias and back pain.  Skin: Negative for rash.  Neurological: Negative for tremors, light-headedness and numbness.  Hematological: Does not bruise/bleed easily.  Psychiatric/Behavioral: Negative for agitation and behavioral problems.    Objective:  BP 126/74   Pulse 95   Ht 5\' 4"  (1.626 m)   Wt (!) 318 lb (144.2 kg)   SpO2 100%   BMI 54.58 kg/m   BP/Weight 09/06/2020 07/13/2020 02/03/2020    Systolic BP 126 115 122  Diastolic BP 74 70 76  Wt. (Lbs) 318 311 -  BMI 54.58 53.38 53.14      Physical Exam Constitutional:      Appearance: She is well-developed.  Neck:     Vascular: No JVD.  Cardiovascular:     Rate and Rhythm: Normal rate.     Heart sounds: Normal heart sounds. No murmur heard.   Pulmonary:     Effort: Pulmonary effort is normal.     Breath sounds: Normal breath sounds. No wheezing or rales.  Chest:     Chest wall: No tenderness.  Abdominal:     General: Bowel sounds are normal. There is no distension.     Palpations: Abdomen is soft. There is no mass.     Tenderness: There is no abdominal tenderness.  Genitourinary:    Comments: External genitalia, vagina, cervix, adnexa-normal Musculoskeletal:        General: Normal range of motion.     Right lower leg: No edema.     Left lower leg: No edema.  Neurological:     Mental Status: She is alert and oriented to person, place, and time.  Psychiatric:        Mood and Affect: Mood normal.     CMP Latest Ref Rng & Units 07/13/2020 09/18/2017  Glucose 65 - 99 mg/dL 66 14/09/2017)  BUN 6 - 20 mg/dL 10 8  Creatinine 917(H - 1.00 mg/dL 1.50 5.69  Sodium 7.94 - 144 mmol/L 138 135  Potassium 3.5 - 5.2 mmol/L 3.9 3.2(L)  Chloride 96 - 106 mmol/L 99 104  CO2 20 - 29 mmol/L 23 25  Calcium 8.7 - 10.2 mg/dL 8.8 801)     CBC    Component Value Date/Time   WBC 10.1 07/13/2020 1443   WBC 10.9 (H) 09/18/2017 2046   RBC 4.69 07/13/2020 1443   RBC 4.56 09/18/2017 2046   HGB 11.3 07/13/2020 1443   HCT 35.3 07/13/2020 1443   PLT 418 07/13/2020 1443   MCV 75 (L) 07/13/2020 1443   MCH 24.1 (L) 07/13/2020 1443   MCH 23.0 (L) 09/18/2017 2046   MCHC 32.0 07/13/2020 1443   MCHC 31.5 09/18/2017 2046   RDW 16.0 (H) 07/13/2020 1443   LYMPHSABS 2.5 07/13/2020 1443   EOSABS 0.2 07/13/2020 1443   BASOSABS 0.0 07/13/2020 1443    Lab Results  Component Value Date   HGBA1C 5.5 07/13/2020    Assessment & Plan:  1.  Abnormal uterine bleeding Currently on OCP We will send of Pap smear to exclude underlying pathology If abnormal uterine bleed persist consider different OCP with shorter hormonal free pills or an extended regimen of 3 months  2. Screening for cervical cancer - Cytology - PAP([place])  3. Screening for STD (sexually transmitted disease) - Cervicovaginal ancillary only  No orders of the defined types were placed in this encounter.   Follow-up: Return in  about 3 months (around 12/05/2020) for Abnormal uterine bleed.       Hoy Register, MD, FAAFP. [facility] [place], [place] [phone]   09/06/2020, 5:46 PM

## 2020-09-06 NOTE — Progress Notes (Signed)
Wants to discuss bleeding while on birth control.  Still having irregular periods.

## 2020-09-06 NOTE — Patient Instructions (Signed)
Abnormal Uterine Bleeding °Abnormal uterine bleeding means bleeding more than usual from your uterus. It can include: °· Bleeding between periods. °· Bleeding after sex. °· Bleeding that is heavier than normal. °· Periods that last longer than usual. °· Bleeding after you have stopped having your period (menopause). °There are many problems that may cause this. You should see a doctor for any kind of bleeding that is not normal. Treatment depends on the cause of the bleeding. °Follow these instructions at home: °· Watch your condition for any changes. °· Do not use tampons, douche, or have sex, if your doctor tells you not to. °· Change your pads often. °· Get regular well-woman exams. Make sure they include a pelvic exam and cervical cancer screening. °· Keep all follow-up visits as told by your doctor. This is important. °Contact a doctor if: °· The bleeding lasts more than one week. °· You feel dizzy at times. °· You feel like you are going to throw up (nauseous). °· You throw up. °Get help right away if: °· You pass out. °· You have to change pads every hour. °· You have belly (abdominal) pain. °· You have a fever. °· You get sweaty. °· You get weak. °· You passing large blood clots from your vagina. °Summary °· Abnormal uterine bleeding means bleeding more than usual from your uterus. °· There are many problems that may cause this. You should see a doctor for any kind of bleeding that is not normal. °· Treatment depends on the cause of the bleeding. °This information is not intended to replace advice given to you by your health care provider. Make sure you discuss any questions you have with your health care provider. °Document Revised: 09/18/2016 Document Reviewed: 09/18/2016 °Elsevier Patient Education © 2020 [employer] Inc[employer] ° °

## 2020-09-07 DIAGNOSIS — Z419 Encounter for procedure for purposes other than remedying health state, unspecified: Secondary | ICD-10-CM | POA: Diagnosis not present

## 2020-09-07 LAB — CERVICOVAGINAL ANCILLARY ONLY
Bacterial Vaginitis (gardnerella): POSITIVE — AB
Candida Glabrata: NEGATIVE
Candida Vaginitis: NEGATIVE
Chlamydia: NEGATIVE
Comment: NEGATIVE
Comment: NEGATIVE
Comment: NEGATIVE
Comment: NEGATIVE
Comment: NEGATIVE
Comment: NORMAL
Neisseria Gonorrhea: NEGATIVE
Trichomonas: NEGATIVE

## 2020-09-09 LAB — CYTOLOGY - PAP: Diagnosis: NEGATIVE

## 2020-09-12 ENCOUNTER — Other Ambulatory Visit: Payer: Self-pay | Admitting: Family Medicine

## 2020-09-12 ENCOUNTER — Other Ambulatory Visit: Payer: Self-pay

## 2020-09-12 MED ORDER — METRONIDAZOLE 0.75 % VA GEL: 1.0000 | Freq: Every day | VAGINAL | 0 refills | Status: DC

## 2020-09-22 MED FILL — metroNIDAZOLE 0.75 % GEL: 0.75 | 5 days supply | Qty: 70 | Fill #0

## 2020-09-26 ENCOUNTER — Emergency Department (HOSPITAL_COMMUNITY): Payer: 59

## 2020-09-26 ENCOUNTER — Other Ambulatory Visit: Payer: Self-pay

## 2020-09-26 ENCOUNTER — Emergency Department (HOSPITAL_COMMUNITY)
Admission: EM | Admit: 2020-09-26 | Discharge: 2020-09-26 | Disposition: A | Discharge status: Home / Self Care | Payer: 59 | Attending: Emergency Medicine | Admitting: Emergency Medicine

## 2020-09-26 ENCOUNTER — Encounter (HOSPITAL_COMMUNITY): Payer: Self-pay

## 2020-09-26 ENCOUNTER — Other Ambulatory Visit (HOSPITAL_COMMUNITY): Payer: Self-pay | Admitting: Emergency Medicine

## 2020-09-26 DIAGNOSIS — M5459 Other low back pain: Secondary | ICD-10-CM | POA: Diagnosis not present

## 2020-09-26 DIAGNOSIS — J45909 Unspecified asthma, uncomplicated: Secondary | ICD-10-CM | POA: Diagnosis not present

## 2020-09-26 DIAGNOSIS — M545 Low back pain, unspecified: Secondary | ICD-10-CM | POA: Insufficient documentation

## 2020-09-26 DIAGNOSIS — M542 Cervicalgia: Secondary | ICD-10-CM | POA: Diagnosis not present

## 2020-09-26 DIAGNOSIS — Z041 Encounter for examination and observation following transport accident: Secondary | ICD-10-CM | POA: Diagnosis not present

## 2020-09-26 DIAGNOSIS — Z7722 Contact with and (suspected) exposure to environmental tobacco smoke (acute) (chronic): Secondary | ICD-10-CM | POA: Insufficient documentation

## 2020-09-26 DIAGNOSIS — Y92410 Unspecified street and highway as the place of occurrence of the external cause: Secondary | ICD-10-CM | POA: Insufficient documentation

## 2020-09-26 MED ORDER — IBUPROFEN 200 MG PO TABS
600.0000 mg | ORAL_TABLET | Freq: Once | ORAL | Status: DC
  Filled 2020-09-26: qty 3

## 2020-09-26 MED ORDER — CYCLOBENZAPRINE HCL 10 MG PO TABS: 10.0000 mg | ORAL_TABLET | Freq: Two times a day (BID) | ORAL | 0 refills | Status: DC | PRN

## 2020-09-26 MED ORDER — ACETAMINOPHEN 500 MG PO TABS
1000.0000 mg | ORAL_TABLET | Freq: Once | ORAL | Status: AC
  Administered 2020-09-26: 02:00:00 1000 mg via ORAL
  Filled 2020-09-26: qty 2

## 2020-09-26 MED ORDER — METHOCARBAMOL 500 MG PO TABS
1000.0000 mg | ORAL_TABLET | Freq: Once | ORAL | Status: AC
  Administered 2020-09-26: 02:00:00 1000 mg via ORAL
  Filled 2020-09-26: qty 2

## 2020-09-26 MED FILL — JUNEL FE 1.5/30 TABLET: 1.5-30 | 28 days supply | Qty: 28 | Fill #3

## 2020-09-26 MED FILL — CYCLOBENZAPRINE HCL 10 MG T: 10 | 10 days supply | Qty: 20 | Fill #0
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
## 2020-09-26 NOTE — Discharge Instructions (Signed)
You were evaluated in the Emergency Department and after careful evaluation, we did not find any emergent condition requiring admission or further testing in the hospital.  Your exam/testing today was overall reassuring.  X-rays did not show any broken bones.  Your symptoms seem to be due to muscle strain or spasm.  Please use Tylenol 1000 mg every 4-6 hours and/or Motrin 600 mg every 4-6 hours for pain.  You can use the Flexeril muscle relaxer provided for more significant pain.  Please return to the Emergency Department if you experience any worsening of your condition.  Thank you for allowing [place] to be a part of your care.
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
## 2020-10-08 DIAGNOSIS — Z419 Encounter for procedure for purposes other than remedying health state, unspecified: Secondary | ICD-10-CM | POA: Diagnosis not present

## 2020-10-25 MED FILL — JUNEL FE 1.5/30 TABLET: 1.5-30 | 28 days supply | Qty: 28 | Fill #4

## 2020-11-08 DIAGNOSIS — Z419 Encounter for procedure for purposes other than remedying health state, unspecified: Secondary | ICD-10-CM | POA: Diagnosis not present

## 2020-11-25 ENCOUNTER — Telehealth: Payer: Self-pay | Admitting: Family Medicine

## 2020-11-25 ENCOUNTER — Ambulatory Visit: Payer: Self-pay | Admitting: *Deleted

## 2020-11-25 MED FILL — BLISOVI FE 1.5/30 1.5-30 MG: 1.5-30 | 28 days supply | Qty: 28 | Fill #5

## 2020-11-25 NOTE — Telephone Encounter (Signed)
Spoke with Arlys John, Pharmacist and he says they would like to change her to another generic lo-estrin because it is no longer in stock; he reiterates that the substitution is the same medication but has a different manufacturer; pt notified and verbalized understanding; she would lile to pick up this medication; the pt is seen at [facility]; will route to office for notification.

## 2020-11-25 NOTE — Telephone Encounter (Signed)
I do not have enough information to work with. Can we please obtain details of what she wants from the patient? Thanks.

## 2020-11-25 NOTE — Telephone Encounter (Signed)
Pt states that she spoke with a triage nurses and all questions were answered.

## 2020-11-25 NOTE — Telephone Encounter (Signed)
Per intitial encounter, "Pt called stating that the pharmacy is not able to continue giving her the [facility] she has been taking due to not having any in stock. She states that they have offered her another brand, but she is not sure whether it is okay to take it. She is requesting to have a call back to discuss this further. Please advise."; contacted pt to discuss; she was taking Junel; the pharmacy says they no longer order this medication and would like to give her a substitution; she would like to talk with her PCP regarding this switch; the pt is also concerned because she is to start a new pack of pills on 11/27/20; will contact pharmacy for more information.

## 2020-11-25 NOTE — Telephone Encounter (Signed)
  Reason for Disposition . Caller has medicine question only, adult not sick, AND triager answers question  Answer Assessment - Initial Assessment Questions 1. NAME of MEDICATION: "What medicine are you calling about?"     Jeanette Hernandez 2. QUESTION: "What is your question?" (e.g., medication refill, side effect)     Can pharmacy switch med 3. PRESCRIBING HCP: "Who prescribed it?" Reason: if prescribed by specialist, call should be referred to that group.    Dr Alvis Lemmings 4. SYMPTOMS: "Do you have any symptoms?"     none 5. SEVERITY: If symptoms are present, ask "Are they mild, moderate or severe?"    n/a 6. PREGNANCY:  "Is there any chance that you are pregnant?" "When was your last menstrual period?"  Protocols used: MEDICATION QUESTION CALL-A-AH

## 2020-11-25 NOTE — Telephone Encounter (Signed)
error 

## 2020-12-06 ENCOUNTER — Other Ambulatory Visit: Payer: Self-pay

## 2020-12-06 ENCOUNTER — Encounter: Payer: Self-pay | Admitting: Family Medicine

## 2020-12-06 ENCOUNTER — Other Ambulatory Visit: Payer: Self-pay | Admitting: Family Medicine

## 2020-12-06 ENCOUNTER — Ambulatory Visit: Payer: 59 | Attending: Family Medicine | Admitting: Family Medicine

## 2020-12-06 VITALS — BP 113/69 | HR 74 | Ht 64.0 in | Wt 319.4 lb

## 2020-12-06 DIAGNOSIS — N946 Dysmenorrhea, unspecified: Secondary | ICD-10-CM

## 2020-12-06 DIAGNOSIS — Z3041 Encounter for surveillance of contraceptive pills: Secondary | ICD-10-CM

## 2020-12-06 DIAGNOSIS — Z419 Encounter for procedure for purposes other than remedying health state, unspecified: Secondary | ICD-10-CM | POA: Diagnosis not present

## 2020-12-06 MED ORDER — NORETHIN ACE-ETH ESTRAD-FE 1.5-30 MG-MCG PO TABS: 1.0000 | ORAL_TABLET | Freq: Every day | ORAL | 3 refills | Status: DC

## 2020-12-06 NOTE — Progress Notes (Signed)
Subjective:  Patient ID: Jeanette Hernandez, female    DOB: May 10, 2001  Age: 20 y.o. MRN: [id number]  CC: No chief complaint on file.   HPI Jeanette Hernandez is a 20 year old female with menorrhagia commenced on oral contraceptive pills at previous visit who presents today for follow-up visit. The pharmacy had changed the brand name of her oral contraceptive pill and she had called prior to this appointment to ensure that it was okay to take the new medication. She endorses compliance with her oral contraceptive pills. Her cycles are light now with no menorrhagia. She does have slight bloating but no abdominal cramping. LMP 2/14-2/18/22. She has no additional concerns today.  Past Medical History:  Diagnosis Date  . Asthma   . Eczema    as a child  . Urticaria     History reviewed. No pertinent surgical history.  Family History  Problem Relation Age of Onset  . Asthma Father     Allergies  Allergen Reactions  . Fish Allergy Anaphylaxis  . Amoxicillin     Outpatient Medications Prior to Visit  Medication Sig Dispense Refill  . albuterol (PROAIR HFA) 108 (90 Base) MCG/ACT inhaler Inhale 2 puffs into the lungs every 6 (six) hours as needed for wheezing or shortness of breath. 1 Inhaler 1  . albuterol (PROVENTIL) (2.5 MG/3ML) 0.083% nebulizer solution Take 3 mLs (2.5 mg total) by nebulization every 4 (four) hours as needed for wheezing or shortness of breath. 75 mL 1  . [facility] ALLERGY 25 MG capsule TAKE 1 CAPSULE BY MOUTH EVERY 6 HOURS FOR 4 DAYS  0  . cyclobenzaprine (FLEXERIL) 10 MG tablet Take 1 tablet (10 mg total) by mouth 2 (two) times daily as needed for muscle spasms. 20 tablet 0  . EPINEPHrine (EPIPEN 2-PAK) 0.3 mg/0.3 mL IJ SOAJ injection Inject 0.3 mLs (0.3 mg total) into the muscle once. 4 Device 1  . fexofenadine (ALLEGRA) 180 MG tablet Take 1 tablet (180 mg total) by mouth daily. 30 tablet 5  . montelukast (SINGULAIR) 10 MG tablet Take 1 tablet (10 mg total) by mouth at  bedtime. 30 tablet 5  . Multiple Vitamins-Minerals (MULTIVITAMIN PO) Take by mouth.    . norethindrone-ethinyl estradiol-iron (MICROGESTIN FE 1.5/30) 1.5-30 MG-MCG tablet Take 1 tablet by mouth daily. 28 tablet 11  . famotidine (PEPCID) 20 MG tablet Take 1 tablet (20 mg total) by mouth 2 (two) times daily for 4 days. 8 tablet 0  . fluticasone (FLONASE) 50 MCG/ACT nasal spray One spray per nostril 1-2 times daily as needed (Patient not taking: No sig reported) 16 g 5  . ibuprofen (ADVIL,MOTRIN) 800 MG tablet Take 1 tablet (800 mg total) by mouth 3 (three) times daily. (Patient not taking: No sig reported) 21 tablet 0  . levocetirizine (XYZAL) 5 MG tablet Take 1 tablet (5 mg total) by mouth every evening. (Patient not taking: No sig reported) 30 tablet 5  . metroNIDAZOLE (METROGEL VAGINAL) 0.75 % vaginal gel Place 1 Applicatorful vaginally at bedtime. (Patient not taking: Reported on 12/06/2020) 70 g 0  . mometasone (NASONEX) 50 MCG/ACT nasal spray Place 2 sprays into the nose daily. (Patient not taking: No sig reported) 17 g 5  . Nutritional Supplements (EQUATE PO) Take by mouth. (Patient not taking: No sig reported)    . triamcinolone (NASACORT) 55 MCG/ACT AERO nasal inhaler Place 2 sprays into the nose daily. (Patient not taking: No sig reported) 1 Inhaler 5   No facility-administered medications prior to visit.  ROS Review of Systems  Constitutional: Negative for activity change, appetite change and fatigue.  HENT: Negative for congestion, sinus pressure and sore throat.   Eyes: Negative for visual disturbance.  Respiratory: Negative for cough, chest tightness, shortness of breath and wheezing.   Cardiovascular: Negative for chest pain and palpitations.  Gastrointestinal: Negative for abdominal distention, abdominal pain and constipation.  Endocrine: Negative for polydipsia.  Genitourinary: Negative for dysuria and frequency.  Musculoskeletal: Negative for arthralgias and back pain.   Skin: Negative for rash.  Neurological: Negative for tremors, light-headedness and numbness.  Hematological: Does not bruise/bleed easily.  Psychiatric/Behavioral: Negative for agitation and behavioral problems.    Objective:  BP 113/69   Pulse 74   Ht 5\' 4"  (1.626 m)   Wt (!) 319 lb 6.4 oz (144.9 kg)   SpO2 99%   BMI 54.82 kg/m   BP/Weight 12/06/2020 09/26/2020 09/06/2020  Systolic BP 113 130 126  Diastolic BP 69 90 74  Wt. (Lbs) 319.4 318 318  BMI 54.82 54.58 54.58      Physical Exam Constitutional:      Appearance: She is well-developed. She is obese.  Neck:     Vascular: No JVD.  Cardiovascular:     Rate and Rhythm: Normal rate.     Heart sounds: Normal heart sounds. No murmur heard.   Pulmonary:     Effort: Pulmonary effort is normal.     Breath sounds: Normal breath sounds. No wheezing or rales.  Chest:     Chest wall: No tenderness.  Abdominal:     General: Bowel sounds are normal. There is no distension.     Palpations: Abdomen is soft. There is no mass.     Tenderness: There is no abdominal tenderness.  Musculoskeletal:        General: Normal range of motion.     Right lower leg: No edema.     Left lower leg: No edema.  Neurological:     Mental Status: She is alert and oriented to person, place, and time.  Psychiatric:        Mood and Affect: Mood normal.     CMP Latest Ref Rng & Units 07/13/2020 09/18/2017  Glucose 65 - 99 mg/dL 66 14/09/2017)  BUN 6 - 20 mg/dL 10 8  Creatinine 416(S - 1.00 mg/dL 0.63 0.16  Sodium 0.10 - 144 mmol/L 138 135  Potassium 3.5 - 5.2 mmol/L 3.9 3.2(L)  Chloride 96 - 106 mmol/L 99 104  CO2 20 - 29 mmol/L 23 25  Calcium 8.7 - 10.2 mg/dL 8.8 932)    Lipid Panel  No results found for: CHOL, TRIG, HDL, CHOLHDL, VLDL, LDLCALC, LDLDIRECT  CBC    Component Value Date/Time   WBC 10.1 07/13/2020 1443   WBC 10.9 (H) 09/18/2017 2046   RBC 4.69 07/13/2020 1443   RBC 4.56 09/18/2017 2046   HGB 11.3 07/13/2020 1443   HCT 35.3  07/13/2020 1443   PLT 418 07/13/2020 1443   MCV 75 (L) 07/13/2020 1443   MCH 24.1 (L) 07/13/2020 1443   MCH 23.0 (L) 09/18/2017 2046   MCHC 32.0 07/13/2020 1443   MCHC 31.5 09/18/2017 2046   RDW 16.0 (H) 07/13/2020 1443   LYMPHSABS 2.5 07/13/2020 1443   EOSABS 0.2 07/13/2020 1443   BASOSABS 0.0 07/13/2020 1443    Lab Results  Component Value Date   HGBA1C 5.5 07/13/2020    Assessment & Plan:  1. Menorrhalgia Controlled Continue current oral contraceptive medication - norethindrone-ethinyl estradiol-iron (MICROGESTIN  FE 1.5/30) 1.5-30 MG-MCG tablet; Take 1 tablet by mouth daily.  Dispense: 90 tablet; Refill: 3  2. Surveillance for birth control, oral contraceptives See #1 above   Health Care Maintenance: Up-to-date on Pap smear and STD screening Meds ordered this encounter  Medications  . norethindrone-ethinyl estradiol-iron (MICROGESTIN FE 1.5/30) 1.5-30 MG-MCG tablet    Sig: Take 1 tablet by mouth daily.    Dispense:  90 tablet    Refill:  3    3 months supply    Follow-up: Return in about 7 months (around 07/13/2021) for annual physical exam.       Hoy Register, MD, FAAFP. [facility] [place], [place] [phone]   12/06/2020, 5:09 PM

## 2020-12-06 NOTE — Patient Instructions (Signed)
Oral Contraception Use Oral contraceptive pills (OCPs) are medicines that prevent pregnancy. OCPs work by:  Preventing the ovaries from releasing eggs.  Thickening mucus in the lower part of the uterus (cervix). This prevents sperm from entering the uterus.  Thinning the lining of the uterus (endometrium). This prevents a fertilized egg from attaching to the endometrium. Discuss possible side effects of OCPs with your health care provider. It can take 2-3 months for your body to adjust to changes in hormone levels. What are the risks? OCPs can sometimes cause side effects, such as:  Headache.  Depression.  Trouble sleeping.  Nausea and vomiting.  Breast tenderness.  Irregular bleeding or spotting during the first several months.  Bloating or fluid retention.  Increase in blood pressure. OCPs with estrogen and progestins may slightly increase the risk of:  Blood clots.  Heart attack.  Stroke. How to take OCPs Follow instructions from your health care provider about how to take your first cycle of OCPs. There are 2 types of OCPs. The first, combination OCPs, have both estrogen and progestins. The second, progestin-only pills, have only progestin.  For combination OCPs, you may start the pill: ? On day 1 of your menstrual period. ? On the first Sunday after your period starts, or on the day you get your prescription. ? At any time of your cycle. ? If you start taking the pill within 5 days after the start of your period, you will not need a backup form of birth control, such as condoms. ? If you start at any other time of your menstrual cycle, you will need to use a backup form of birth control.  For progestin-only OCPs: ? Ideally, you can start taking the pill on the first day of your menstrual period, but you can start it on any other day too. ? These pills will protect you from pregnancy after taking it for 2 days (48 hours). You can stop using a backup form of birth  control after that time. It is important that you take this pill at the same time every day. Even taking it 3 hours late can increase the risk of pregnancy. No matter which day you start the OCP, you will always start a new pack on that same day of the week. Have an extra pack of OCPs and a backup contraceptive method available in case you miss some pills or lose your OCP pack. Missed doses Follow instructions from your health care provider for missed doses. Information about missed doses can also be found in the patient information sheet that comes with your pack of pills.  In general, for combined OCPs: ? If you forget to take the pill for 1 day, take it as soon as you can. This may mean taking 2 pills on the same day and at the same time. Take the next day's pill at the regular time. ? If you forget to take the pill for 2 days in a row, take 2 tablets on the day you remember and 2 tablets on the following day. A backup form of birth control should be used for 7 days after you are back on schedule. ? If you forget to take the pill for 3 days in a row, call your health care provider for directions on when to restart taking your pills. Do not take the missed pills. A backup form of birth control will be needed for 7 days once you restart your pills. ? If you use a pack that   contains inactive pills and you miss 1 or more of the inactive pills, you do not need to take the missed doses. Skip them and start the new pack on the regular day.  For progestin-only OCPs: ? If your dose is 3 hours or more late, or if you miss 1 or more doses, take 1 missed pill as soon as you can. ? If you miss one or more doses, you must use a backup form of birth control. Some brands of progestin-only pills recommend using a backup form of birth control for 48 hours after a missed or late dose while others recommend 7 days. If you are not sure what to do, call your health care provider or check the patient information sheet that  came with your pills.   Follow these instructions at home:  Do not use any products that contain nicotine or tobacco. These include cigarettes, chewing tobacco, or vaping devices, such as e-cigarettes. If you need help quitting, ask your health care provider.  Always use a condom to protect against STIs (sexually transmitted infections). Oral contraception pills do not protect against STIs.  Use a calendar to mark the days of your menstrual period.  Read the information sheet and directions that came with your OCP. Talk to your health care provider if you have questions. Contact a health care provider if:  You develop nausea and vomiting.  You have abnormal vaginal discharge or bleeding.  You develop a rash.  You miss your menstrual period. Depending on the type of OCP you are taking, this may be a sign of pregnancy.  You are losing your hair.  You need treatment for mood swings or depression.  You get dizzy when taking the OCP.  You develop acne after taking the OCP.  You become pregnant or think you may be pregnant.  You have diarrhea, constipation, and abdominal pain or cramps.  You are not sure what to do after missing pills. Get help right away if:  You develop chest pain.  You develop shortness of breath.  You have an uncontrolled or severe headache.  You develop numbness or slurred speech.  You develop vision or speech problems.  You develop pain, redness, and swelling in your legs.  You develop weakness or numbness in your arms or legs. These symptoms may represent a serious problem that is an emergency. Do not wait to see if the symptoms will go away. Get medical help right away. Call your local emergency services (911 in the [place] Do not drive yourself to the hospital. Summary  Oral contraceptive pills (OCPs) are medicines that you take to prevent pregnancy.  OCPs do not prevent sexually transmitted infections (STIs). Always use a condom to protect  against STIs.  When you start an OCP, be aware that it can take 2-3 months for your body to adjust to changes in hormone levels.  Read all the information and directions that come with your OCP. This information is not intended to replace advice given to you by your health care provider. Make sure you discuss any questions you have with your health care provider. Document Revised: 06/02/2020 Document Reviewed: 06/02/2020 Elsevier Patient Education  2021 [employer]  

## 2020-12-06 NOTE — Progress Notes (Signed)
Birth control has changed

## 2021-01-06 DIAGNOSIS — Z419 Encounter for procedure for purposes other than remedying health state, unspecified: Secondary | ICD-10-CM | POA: Diagnosis not present

## 2021-01-10 ENCOUNTER — Other Ambulatory Visit: Payer: Self-pay

## 2021-01-10 ENCOUNTER — Encounter: Payer: Self-pay | Admitting: Allergy

## 2021-01-10 ENCOUNTER — Other Ambulatory Visit (HOSPITAL_COMMUNITY): Payer: Self-pay

## 2021-01-10 ENCOUNTER — Ambulatory Visit (INDEPENDENT_AMBULATORY_CARE_PROVIDER_SITE_OTHER): Payer: 59 | Admitting: Allergy

## 2021-01-10 VITALS — BP 118/70 | HR 86 | Temp 97.7°F | Resp 16 | Wt 316.0 lb

## 2021-01-10 DIAGNOSIS — T7800XD Anaphylactic reaction due to unspecified food, subsequent encounter: Secondary | ICD-10-CM

## 2021-01-10 DIAGNOSIS — J4520 Mild intermittent asthma, uncomplicated: Secondary | ICD-10-CM | POA: Diagnosis not present

## 2021-01-10 DIAGNOSIS — J069 Acute upper respiratory infection, unspecified: Secondary | ICD-10-CM | POA: Diagnosis not present

## 2021-01-10 DIAGNOSIS — J3089 Other allergic rhinitis: Secondary | ICD-10-CM

## 2021-01-10 MED ORDER — FLUTICASONE PROPIONATE 50 MCG/ACT NA SUSP
1.0000 | Freq: Two times a day (BID) | NASAL | 5 refills | Status: DC | PRN
  Filled 2021-01-10: qty 16, 30d supply, fill #0

## 2021-01-10 MED ORDER — EPINEPHRINE 0.3 MG/0.3ML IJ SOAJ
0.3000 mg | INTRAMUSCULAR | 2 refills | Status: DC | PRN
  Filled 2021-01-10: qty 2, 30d supply, fill #0

## 2021-01-10 MED ORDER — ALBUTEROL SULFATE (2.5 MG/3ML) 0.083% IN NEBU
2.5000 mg | INHALATION_SOLUTION | RESPIRATORY_TRACT | 1 refills | Status: DC | PRN
  Filled 2021-01-10: qty 90, 5d supply, fill #0

## 2021-01-10 MED ORDER — MONTELUKAST SODIUM 10 MG PO TABS
10.0000 mg | ORAL_TABLET | Freq: Every day | ORAL | 5 refills | Status: DC
  Filled 2021-01-10: qty 30, 30d supply, fill #0

## 2021-01-10 NOTE — Progress Notes (Signed)
Follow Up Note  RE: Jeanette Hernandez MRN: [id number] DOB: [birth year] Date of Office Visit: 01/10/2021  Referring provider: Hoy Register, MD Primary care provider: Hoy Register, MD  Chief Complaint: Allergies (Wednesday or Thursday night had a bad headache and was coughing up yellow mucus, and now it's clear. )  History of Present Illness: I had the pleasure of seeing Jeanette Hernandez for a follow up visit at the [facility] on 01/11/2021. She is a 20 y.o. female, who is being followed for food allergy, allergic rhinitis, asthma. Her previous allergy office visit was on 02/03/2020 with Dr. Delorse Lek. Today is a new complaint visit of allergies.  URI: Last Wednesday/Thursday started to have coughing with some mucous which is cleared up now, headaches, nasal congestion and difficult to breathe through her nose.  No fevers or chills.   She took some pseudofed, Vicks sinus pills with good benefit. No sick contacts.   Symptoms are slowly improving.   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 Needs Epipen refilled.   Allergic rhinitis Takes allegra 180mg  daily and simple saline nasal spray.  Mild intermittent asthma Prior to URI only had to use albuterol on rare occasion every few months. Stopped Singulair about 1 year ago.   Assessment and Plan: Sabirin is a 20 y.o. female with: Viral upper respiratory infection Improving viral URI symptoms. No fevers.   Gave handout on symptomatic treatment.  No indication for any antibiotics or prednisone at this time as symptoms are improving.   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  Continue strict avoidance of fish.  For mild symptoms you can take over the counter antihistamines such as Benadryl and  monitor symptoms closely. If symptoms worsen or if you have severe symptoms including breathing issues, throat closure, significant swelling, whole body hives, severe diarrhea and vomiting, lightheadedness then inject epinephrine and seek immediate medical care afterwards.  Action plan in place.   Perennial allergic rhinitis Past history - 2019 skin testing positive to dust mites and cats.  Interim history - noticed some increased symptoms.   Continue environmental control measures.  May use over the counter antihistamines such as Allegra (fexofenadine) daily as needed.  RESTART Singulair (montelukast) 10mg  daily at night.  May use Flonase (fluticasone) nasal spray 1 spray per nostril twice a day as needed for nasal congestion.   Mild intermittent asthma Stopped Singulair 1 year ago. Using albuterol more frequently during current URI but beforehand only used rarely. . Daily controller medication(s): RESTART Singulair (montelukast) 10mg  daily at night. . May use albuterol rescue inhaler 2 puffs or nebulizer every 4 to 6 hours as needed for shortness of breath, chest tightness, coughing, and wheezing. May use albuterol rescue inhaler 2 puffs 5 to 15 minutes prior to strenuous physical activities. Monitor frequency of use.  . Get spirometry at next visit.   Return in about 6 months (around 07/12/2021).  Meds ordered this encounter  Medications  . EPINEPHrine (EPIPEN 2-PAK) 0.3 mg/0.3 mL IJ SOAJ injection    Sig: Inject 0.3 mg into the muscle as needed for anaphylaxis.    Dispense:  2 each    Refill:  2  . fluticasone (FLONASE) 50 MCG/ACT nasal spray    Sig: Place 1 spray into both  nostrils 2 (two) times daily as needed.    Dispense:  16 g    Refill:  5  . montelukast (SINGULAIR) 10 MG tablet    Sig: Take 1 tablet (10 mg total) by mouth at bedtime.    Dispense:  30 tablet    Refill:  5  . albuterol (PROVENTIL) (2.5 MG/3ML) 0.083% nebulizer solution    Sig: Inhale 3 mLs (2.5 mg  total) by nebulization every 4 (four) hours as needed for wheezing or shortness of breath.    Dispense:  90 mL    Refill:  1   Lab Orders  No laboratory test(s) ordered today    Diagnostics: None  Medication List:  Current Outpatient Medications  Medication Sig Dispense Refill  . albuterol (PROAIR HFA) 108 (90 Base) MCG/ACT inhaler Inhale 2 puffs into the lungs every 6 (six) hours as needed for wheezing or shortness of breath. 1 Inhaler 1  . [facility] ALLERGY 25 MG capsule TAKE 1 CAPSULE BY MOUTH EVERY 6 HOURS FOR 4 DAYS  0  . fexofenadine (ALLEGRA) 180 MG tablet Take 1 tablet (180 mg total) by mouth daily. 30 tablet 5  . fluticasone (FLONASE) 50 MCG/ACT nasal spray Place 1 spray into both nostrils 2 (two) times daily as needed. 16 g 5  . ibuprofen (ADVIL,MOTRIN) 800 MG tablet Take 1 tablet (800 mg total) by mouth 3 (three) times daily. 21 tablet 0  . montelukast (SINGULAIR) 10 MG tablet Take 1 tablet (10 mg total) by mouth at bedtime. 30 tablet 5  . Multiple Vitamins-Minerals (MULTIVITAMIN PO) Take by mouth.    . norethindrone-ethinyl estradiol-iron (LOESTRIN FE) 1.5-30 MG-MCG tablet TAKE 1 TABLET BY MOUTH DAILY. 84 tablet 3  . albuterol (PROVENTIL) (2.5 MG/3ML) 0.083% nebulizer solution Inhale 3 mLs (2.5 mg total) by nebulization every 4 (four) hours as needed for wheezing or shortness of breath. 90 mL 1  . EPINEPHrine (EPIPEN 2-PAK) 0.3 mg/0.3 mL IJ SOAJ injection Inject 0.3 mg into the muscle as needed for anaphylaxis. 2 each 2  . famotidine (PEPCID) 20 MG tablet Take 1 tablet (20 mg total) by mouth 2 (two) times daily for 4 days. 8 tablet 0   No current facility-administered medications for this visit.   Allergies: Allergies  Allergen Reactions  . Fish Allergy Anaphylaxis  . Sulfa Antibiotics Anaphylaxis  . Amoxicillin    I reviewed her past medical history, social history, family history, and environmental history and no significant changes have been reported from her previous  visit.  Review of Systems  Constitutional: Negative for appetite change, chills, fever and unexpected weight change.  HENT: Positive for congestion. Negative for rhinorrhea.   Eyes: Negative for itching.  Respiratory: Positive for cough. Negative for chest tightness, shortness of breath and wheezing.   Gastrointestinal: Negative for abdominal pain.  Skin: Negative for rash.  Allergic/Immunologic: Positive for environmental allergies and food allergies.  Neurological: Positive for headaches.   Objective: BP 118/70 (BP Location: Left Arm, Patient Position: Sitting, Cuff Size: Large)   Pulse 86   Temp 97.7 F (36.5 C) (Temporal)   Resp 16   Wt (!) 316 lb (143.3 kg)   SpO2 98%   BMI 54.24 kg/m  Body mass index is 54.24 kg/m. Physical Exam Vitals and nursing note reviewed.  Constitutional:      Appearance: Normal appearance. She is well-developed.  HENT:     Head: Normocephalic and atraumatic.     Right Ear: Tympanic membrane and external ear normal.  Left Ear: Tympanic membrane and external ear normal.     Nose: Nose normal.     Mouth/Throat:     Mouth: Mucous membranes are moist.     Pharynx: Oropharynx is clear.  Eyes:     Conjunctiva/sclera: Conjunctivae normal.  Cardiovascular:     Rate and Rhythm: Normal rate and regular rhythm.     Heart sounds: Normal heart sounds. No murmur heard.   Pulmonary:     Effort: Pulmonary effort is normal.     Breath sounds: Normal breath sounds. No wheezing, rhonchi or rales.  Musculoskeletal:     Cervical back: Neck supple.  Skin:    General: Skin is warm.     Findings: No rash.  Neurological:     Mental Status: She is alert and oriented to person, place, and time.  Psychiatric:        Behavior: Behavior normal.    Previous notes and tests were reviewed. The plan was reviewed with the patient/family, and all questions/concerned were addressed.  It was my pleasure to see [place] today and participate in her care. Please feel  free to contact me with any questions or concerns.  Sincerely,  Wyline Mood, DO Allergy & Immunology  [facility] office: [phone] [facility] office: [phone]

## 2021-01-10 NOTE — Patient Instructions (Addendum)
Upper respiratory infections  Glad you are feeling better.  See below for symptomatic management.   Allergic rhinitis  2019 skin testing positive to dust mites and cats.   Continue environmental control measures.  May use over the counter antihistamines such as Allegra (fexofenadine) daily as needed.  RESTART Singulair (montelukast) 10mg  daily at night.  May use Flonase (fluticasone) nasal spray 1 spray per nostril twice a day as needed for nasal congestion.   You may use over the counter AFRIN nasal decongestant spray twice a day for severe nasal congestion for 3-5 days at a time as needed.   Asthma . Daily controller medication(s): RESTART Singulair (montelukast) 10mg  daily at night. . May use albuterol rescue inhaler 2 puffs or nebulizer every 4 to 6 hours as needed for shortness of breath, chest tightness, coughing, and wheezing. May use albuterol rescue inhaler 2 puffs 5 to 15 minutes prior to strenuous physical activities. Monitor frequency of use.  . Asthma control goals:  o Full participation in all desired activities (may need albuterol before activity) o Albuterol use two times or less a week on average (not counting use with activity) o Cough interfering with sleep two times or less a month o Oral steroids no more than once a year o No hospitalizations  Food allergy  2019 skin testing positive to finned fish.  Continue strict avoidance of fish.  For mild symptoms you can take over the counter antihistamines such as Benadryl and monitor symptoms closely. If symptoms worsen or if you have severe symptoms including breathing issues, throat closure, significant swelling, whole body hives, severe diarrhea and vomiting, lightheadedness then inject epinephrine and seek immediate medical care afterwards.  Action plan in place.   Follow up in 6 months or sooner if needed.    Drink plenty of fluids.  Water, juice, clear broth or warm lemon water are good choices. Avoid  caffeine and alcohol, which can dehydrate you.  Eat chicken soup.  Chicken soup and other warm fluids can be soothing and loosen congestion.  Rest.  Adjust your room's temperature and humidity.  Keep your room warm but not overheated. If the air is dry, a cool-mist humidifier or vaporizer can moisten the air and help ease congestion and coughing. Keep the humidifier clean to prevent the growth of bacteria and molds.  Soothe your throat.  Perform a saltwater gargle. Dissolve one-quarter to a half teaspoon of salt in a 4- to 8-ounce glass of warm water. This can relieve a sore or scratchy throat temporarily.  Use saline nasal drops.  To help relieve nasal congestion, try saline nasal drops. You can buy these drops over the counter, and they can help relieve symptoms ? even in children.  Take over-the-counter cold and cough medications.  For adults and children older than 5, over-the-counter decongestants, antihistamines and pain relievers might offer some symptom relief. However, they won't prevent a cold or shorten its duration.  Control of House Dust Mite Allergen . Dust mite allergens are a common trigger of allergy and asthma symptoms. While they can be found throughout the house, these microscopic creatures thrive in warm, humid environments such as bedding, upholstered furniture and carpeting. . Because so much time is spent in the bedroom, it is essential to reduce mite levels there.  . Encase pillows, mattresses, and box springs in special allergen-proof fabric covers or airtight, zippered plastic covers.  . Bedding should be washed weekly in hot water (130 F) and dried in a hot dryer. Allergen-proof  covers are available for comforters and pillows that can't be regularly washed.  Reyes Ivan the allergy-proof covers every few months. Minimize clutter in the bedroom. Keep pets out of the bedroom.  Marland Kitchen Keep humidity less than 50% by using a dehumidifier or air conditioning. You can buy a  humidity measuring device called a hygrometer to monitor this.  . If possible, replace carpets with hardwood, linoleum, or washable area rugs. If that's not possible, vacuum frequently with a vacuum that has a HEPA filter. . Remove all upholstered furniture and non-washable window drapes from the bedroom. . Remove all non-washable stuffed toys from the bedroom.  Wash stuffed toys weekly. Pet Allergen Avoidance: . Contrary to popular opinion, there are no "hypoallergenic" breeds of dogs or cats. That is because people are not allergic to an animal's hair, but to an allergen found in the animal's saliva, dander (dead skin flakes) or urine. Pet allergy symptoms typically occur within minutes. For some people, symptoms can build up and become most severe 8 to 12 hours after contact with the animal. People with severe allergies can experience reactions in public places if dander has been transported on the pet owners' clothing. Marland Kitchen Keeping an animal outdoors is only a partial solution, since homes with pets in the yard still have higher concentrations of animal allergens. . Before getting a pet, ask your allergist to determine if you are allergic to animals. If your pet is already considered part of your family, try to minimize contact and keep the pet out of the bedroom and other rooms where you spend a great deal of time. . As with dust mites, vacuum carpets often or replace carpet with a hardwood floor, tile or linoleum. . High-efficiency particulate air (HEPA) cleaners can reduce allergen levels over time. . While dander and saliva are the source of cat and dog allergens, urine is the source of allergens from rabbits, hamsters, mice and [place] pigs; so ask a non-allergic family member to clean the animal's cage. . If you have a pet allergy, talk to your allergist about the potential for allergy immunotherapy (allergy shots). This strategy can often provide long-term relief.

## 2021-01-11 ENCOUNTER — Encounter: Payer: Self-pay | Admitting: Allergy

## 2021-01-11 DIAGNOSIS — J069 Acute upper respiratory infection, unspecified: Secondary | ICD-10-CM | POA: Insufficient documentation

## 2021-01-11 DIAGNOSIS — U071 COVID-19: Secondary | ICD-10-CM | POA: Diagnosis not present

## 2021-01-11 DIAGNOSIS — Z20822 Contact with and (suspected) exposure to covid-19: Secondary | ICD-10-CM | POA: Diagnosis not present

## 2021-01-11 NOTE — Assessment & Plan Note (Signed)
Past history - 2019 skin testing positive to finned fish.  Continue strict avoidance of fish.  For mild symptoms you can take over the counter antihistamines such as Benadryl and monitor symptoms closely. If symptoms worsen or if you have severe symptoms including breathing issues, throat closure, significant swelling, whole body hives, severe diarrhea and vomiting, lightheadedness then inject epinephrine and seek immediate medical care afterwards.  Action plan in place.

## 2021-01-11 NOTE — Assessment & Plan Note (Signed)
Past history - 2019 skin testing positive to dust mites and cats.  Interim history - noticed some increased symptoms.   Continue environmental control measures.  May use over the counter antihistamines such as Allegra (fexofenadine) daily as needed.  RESTART Singulair (montelukast) 10mg  daily at night.  May use Flonase (fluticasone) nasal spray 1 spray per nostril twice a day as needed for nasal congestion.

## 2021-01-11 NOTE — Assessment & Plan Note (Signed)
Stopped Singulair 1 year ago. Using albuterol more frequently during current URI but beforehand only used rarely. . Daily controller medication(s): RESTART Singulair (montelukast) 10mg  daily at night. . May use albuterol rescue inhaler 2 puffs or nebulizer every 4 to 6 hours as needed for shortness of breath, chest tightness, coughing, and wheezing. May use albuterol rescue inhaler 2 puffs 5 to 15 minutes prior to strenuous physical activities. Monitor frequency of use.  . Get spirometry at next visit.

## 2021-01-11 NOTE — Assessment & Plan Note (Addendum)
Improving viral URI symptoms. No fevers.   Gave handout on symptomatic treatment.  No indication for any antibiotics or prednisone at this time as symptoms are improving.   May use over the counter AFRIN nasal decongestant spray twice a day for severe nasal congestion for 3-5 days at a time as needed

## 2021-01-12 ENCOUNTER — Other Ambulatory Visit (HOSPITAL_COMMUNITY): Payer: Self-pay

## 2021-01-12 DIAGNOSIS — Z20822 Contact with and (suspected) exposure to covid-19: Secondary | ICD-10-CM | POA: Diagnosis not present

## 2021-01-17 DIAGNOSIS — Z03818 Encounter for observation for suspected exposure to other biological agents ruled out: Secondary | ICD-10-CM | POA: Diagnosis not present

## 2021-01-17 DIAGNOSIS — Z20822 Contact with and (suspected) exposure to covid-19: Secondary | ICD-10-CM | POA: Diagnosis not present

## 2021-02-05 DIAGNOSIS — Z419 Encounter for procedure for purposes other than remedying health state, unspecified: Secondary | ICD-10-CM | POA: Diagnosis not present

## 2021-02-21 ENCOUNTER — Other Ambulatory Visit (HOSPITAL_BASED_OUTPATIENT_CLINIC_OR_DEPARTMENT_OTHER): Payer: Self-pay

## 2021-02-21 MED FILL — Norethindrone Ace & Ethinyl Estradiol-FE Tab 1.5 MG-30 MCG: ORAL | 84 days supply | Qty: 84 | Fill #0 | Status: CN

## 2021-02-22 ENCOUNTER — Other Ambulatory Visit (HOSPITAL_BASED_OUTPATIENT_CLINIC_OR_DEPARTMENT_OTHER): Payer: Self-pay

## 2021-02-22 MED FILL — Norethindrone Ace & Ethinyl Estradiol-FE Tab 1.5 MG-30 MCG: ORAL | 84 days supply | Qty: 84 | Fill #0 | Status: CN

## 2021-02-24 ENCOUNTER — Other Ambulatory Visit (HOSPITAL_BASED_OUTPATIENT_CLINIC_OR_DEPARTMENT_OTHER): Payer: Self-pay

## 2021-02-24 MED FILL — Norethindrone Ace & Ethinyl Estradiol-FE Tab 1.5 MG-30 MCG: ORAL | 84 days supply | Qty: 84 | Fill #0 | Status: AC

## 2021-03-01 ENCOUNTER — Other Ambulatory Visit (HOSPITAL_BASED_OUTPATIENT_CLINIC_OR_DEPARTMENT_OTHER): Payer: Self-pay

## 2021-03-08 DIAGNOSIS — Z419 Encounter for procedure for purposes other than remedying health state, unspecified: Secondary | ICD-10-CM | POA: Diagnosis not present

## 2021-04-07 DIAGNOSIS — Z419 Encounter for procedure for purposes other than remedying health state, unspecified: Secondary | ICD-10-CM | POA: Diagnosis not present

## 2021-05-08 DIAGNOSIS — Z419 Encounter for procedure for purposes other than remedying health state, unspecified: Secondary | ICD-10-CM | POA: Diagnosis not present

## 2021-06-07 ENCOUNTER — Other Ambulatory Visit (HOSPITAL_BASED_OUTPATIENT_CLINIC_OR_DEPARTMENT_OTHER): Payer: Self-pay

## 2021-06-07 MED FILL — Norethindrone Ace & Ethinyl Estradiol-FE Tab 1.5 MG-30 MCG: ORAL | 84 days supply | Qty: 84 | Fill #1 | Status: AC

## 2021-06-08 DIAGNOSIS — Z419 Encounter for procedure for purposes other than remedying health state, unspecified: Secondary | ICD-10-CM | POA: Diagnosis not present
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
## 2021-06-29 ENCOUNTER — Ambulatory Visit: Payer: No Typology Code available for payment source | Attending: Internal Medicine

## 2021-06-29 ENCOUNTER — Other Ambulatory Visit (HOSPITAL_BASED_OUTPATIENT_CLINIC_OR_DEPARTMENT_OTHER): Payer: Self-pay

## 2021-06-29 DIAGNOSIS — Z23 Encounter for immunization: Secondary | ICD-10-CM

## 2021-06-29 MED ORDER — PFIZER COVID-19 VAC BIVALENT 30 MCG/0.3ML IM SUSP
INTRAMUSCULAR | 0 refills | Status: DC
  Filled 2021-06-29: qty 0.3, 1d supply, fill #0

## 2021-06-29 NOTE — Progress Notes (Signed)
   Covid-19 Vaccination Clinic  Name:  Jeanette Hernandez    MRN: [id number] DOB: July 29, 2001  06/29/2021  [name] was observed post Covid-19 immunization for 15 minutes without incident. She was provided with Vaccine Information Sheet and instruction to access the V-Safe system.   [name] was instructed to call 911 with any severe reactions post vaccine: Difficulty breathing  Swelling of face and throat  A fast heartbeat  A bad rash all over body  Dizziness and weakness

## 2021-07-08 DIAGNOSIS — Z419 Encounter for procedure for purposes other than remedying health state, unspecified: Secondary | ICD-10-CM | POA: Diagnosis not present

## 2021-07-12 ENCOUNTER — Encounter: Payer: Self-pay | Admitting: Allergy

## 2021-07-12 ENCOUNTER — Ambulatory Visit (INDEPENDENT_AMBULATORY_CARE_PROVIDER_SITE_OTHER): Payer: 59 | Admitting: Allergy

## 2021-07-12 ENCOUNTER — Other Ambulatory Visit: Payer: Self-pay

## 2021-07-12 ENCOUNTER — Other Ambulatory Visit (HOSPITAL_BASED_OUTPATIENT_CLINIC_OR_DEPARTMENT_OTHER): Payer: Self-pay

## 2021-07-12 VITALS — BP 120/70 | HR 71 | Temp 98.3°F | Resp 16 | Ht 64.0 in | Wt 319.0 lb

## 2021-07-12 DIAGNOSIS — T7800XD Anaphylactic reaction due to unspecified food, subsequent encounter: Secondary | ICD-10-CM | POA: Diagnosis not present

## 2021-07-12 DIAGNOSIS — J4520 Mild intermittent asthma, uncomplicated: Secondary | ICD-10-CM

## 2021-07-12 DIAGNOSIS — J3089 Other allergic rhinitis: Secondary | ICD-10-CM | POA: Diagnosis not present

## 2021-07-12 MED ORDER — MONTELUKAST SODIUM 10 MG PO TABS
10.0000 mg | ORAL_TABLET | Freq: Every day | ORAL | 5 refills | Status: DC
  Filled 2021-07-12: qty 30, 30d supply, fill #0
  Filled 2021-08-24: qty 30, 30d supply, fill #1
  Filled 2021-09-29: qty 30, 30d supply, fill #2
  Filled 2021-11-14: qty 30, 30d supply, fill #3
  Filled 2021-12-21: qty 30, 30d supply, fill #4
  Filled 2022-02-16: qty 30, 30d supply, fill #5

## 2021-07-12 NOTE — Progress Notes (Signed)
Follow-up Note  RE: Jeanette Hernandez MRN: [id number] DOB: 2001-07-15 Date of Office Visit: 07/12/2021   History of present illness: Jeanette Hernandez is a 20 y.o. female presenting today for follow-up of food allergy, allergic rhinitis and asthma.   She was last seen in the office on 01/10/21 by Dr. Selena Batten.   She states the URI that was discussed at her last visit ended up being Covid.  She has recovered from this.   At her last visit she was recommended to restart Singulair.  She states this time of year is when she usually would have flares of asthma and thus Singulair is her controller medication.  She reports using albuterol maybe once over the summer for symptoms outside of when she used for covid symptoms.  She does report using albuterol prior to exercise which she has been getting back into working out.   She denies daytime or nighttime symptoms at this time.  She has not required ED/UC visits or systemic steroids since last visit.  She continues to take allegra which she states does control her allergy symptoms well.  She states if she misses a dose she does note nasal drainage but as long as she is consistent with allegra use nasal drainage is controlled.  She did use flonase when she had Covid.    She continues to avoid finned fish.  She has not had any accidental ingestions or need to use her epinephrine device.     Review of systems in the past 4 weeks: Review of Systems  Constitutional: Negative.   HENT: Negative.    Eyes: Negative.   Respiratory: Negative.    Cardiovascular: Negative.   Gastrointestinal: Negative.   Musculoskeletal: Negative.   Skin: Negative.   Neurological: Negative.    All other systems negative unless noted above in HPI  Past medical/social/surgical/family history have been reviewed and are unchanged unless specifically indicated below.  No changes  Medication List: Current Outpatient Medications  Medication Sig Dispense Refill   albuterol (PROAIR  HFA) 108 (90 Base) MCG/ACT inhaler Inhale 2 puffs into the lungs every 6 (six) hours as needed for wheezing or shortness of breath. 1 Inhaler 1   albuterol (PROVENTIL) (2.5 MG/3ML) 0.083% nebulizer solution Inhale 3 mLs (2.5 mg total) by nebulization every 4 (four) hours as needed for wheezing or shortness of breath. 90 mL 1   [employer] ALLERGY 25 MG capsule TAKE 1 CAPSULE BY MOUTH EVERY 6 HOURS FOR 4 DAYS  0   EPINEPHrine (EPIPEN 2-PAK) 0.3 mg/0.3 mL IJ SOAJ injection Inject 0.3 mg into the muscle as needed for anaphylaxis. 2 each 2   fexofenadine (ALLEGRA) 180 MG tablet Take 1 tablet (180 mg total) by mouth daily. 30 tablet 5   fluticasone (FLONASE) 50 MCG/ACT nasal spray Place 1 spray into both nostrils 2 (two) times daily as needed. 16 g 5   ibuprofen (ADVIL,MOTRIN) 800 MG tablet Take 1 tablet (800 mg total) by mouth 3 (three) times daily. 21 tablet 0   Multiple Vitamins-Minerals (MULTIVITAMIN PO) Take by mouth.     norethindrone-ethinyl estradiol-iron (LOESTRIN FE) 1.5-30 MG-MCG tablet TAKE 1 TABLET BY MOUTH DAILY. 84 tablet 3   COVID-19 mRNA bivalent vaccine, Pfizer, (PFIZER COVID-19 VAC BIVALENT) injection Inject into the muscle. 0.3 mL 0   famotidine (PEPCID) 20 MG tablet Take 1 tablet (20 mg total) by mouth 2 (two) times daily for 4 days. 8 tablet 0   montelukast (SINGULAIR) 10 MG tablet Take 1 tablet (10  mg total) by mouth at bedtime. 30 tablet 5   No current facility-administered medications for this visit.     Known medication allergies: Allergies  Allergen Reactions   Fish Allergy Anaphylaxis   Sulfa Antibiotics Anaphylaxis   Amoxicillin      Physical examination: Blood pressure 120/70, pulse 71, temperature 98.3 F (36.8 C), temperature source Temporal, resp. rate 16, height 5\' 4"  (1.626 m), weight (!) 319 lb (144.7 kg), SpO2 97 %.  General: Alert, interactive, in no acute distress. HEENT: PERRLA, TMs pearly gray, turbinates minimally edematous without discharge, post-pharynx  non erythematous. Neck: Supple without lymphadenopathy. Lungs: Clear to auscultation without wheezing, rhonchi or rales. {no increased work of breathing. CV: Normal S1, S2 without murmurs. Abdomen: Nondistended, nontender. Skin: Warm and dry, without lesions or rashes. Extremities:  No clubbing, cyanosis or edema. Neuro:   Grossly intact.  Diagnositics/Labs:  Spirometry: FEV1: 2.74L 94%, FVC: 3.19L 97%, ratio consistent with nonobstructive pattern  Assessment and plan:   Allergic rhinitis 2019 skin testing positive to dust mites and cats.  Continue environmental control measures. May use over the counter antihistamines such as Allegra (fexofenadine) daily as needed. Continue Singulair (montelukast) 10mg  daily at night. May use Flonase (fluticasone) nasal spray 1 spray per nostril twice a day as needed for nasal congestion.  Let 2020 know if your nasal drainage worsens and you would like an nasal drainage spray  Asthma Daily controller medication(s): Singulair (montelukast) 10mg  daily at night. May use albuterol rescue inhaler 2 puffs or nebulizer every 4 to 6 hours as needed for shortness of breath, chest tightness, coughing, and wheezing. May use albuterol rescue inhaler 2 puffs 5 to 15 minutes prior to strenuous physical activities. Monitor frequency of use.  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 Continue strict avoidance of fish. For mild symptoms you can take over the counter antihistamines such as Benadryl and monitor symptoms closely. If symptoms worsen or if you have severe symptoms including breathing issues, throat closure, significant swelling, whole body  hives, severe diarrhea and vomiting, lightheadedness then inject epinephrine and seek immediate medical care afterwards. Action plan in place.   Follow up in 6-12 months or sooner if needed.     I appreciate the opportunity to take part in [place] care. Please do not hesitate to contact me with questions.  Sincerely,   2020, MD Allergy/Immunology [facility]

## 2021-07-12 NOTE — Addendum Note (Signed)
Addended by: Berna Bue on: 07/12/2021 12:02 PM   Modules accepted: Orders

## 2021-07-12 NOTE — Patient Instructions (Addendum)
Allergic rhinitis 2019 skin testing positive to dust mites and cats.  Continue environmental control measures. May use over the counter antihistamines such as Allegra (fexofenadine) daily as needed. Continue Singulair (montelukast) 10mg  daily at night. May use Flonase (fluticasone) nasal spray 1 spray per nostril twice a day as needed for nasal congestion.  Let know if your nasal drainage worsens and you would like an nasal drainage spray  Asthma Daily controller medication(s): Singulair (montelukast) 10mg  daily at night. May use albuterol rescue inhaler 2 puffs or nebulizer every 4 to 6 hours as needed for shortness of breath, chest tightness, coughing, and wheezing. May use albuterol rescue inhaler 2 puffs 5 to 15 minutes prior to strenuous physical activities. Monitor frequency of use.  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 Continue strict avoidance of fish. For mild symptoms you can take over the counter antihistamines such as Benadryl and monitor symptoms closely. If symptoms worsen or if you have severe symptoms including breathing issues, throat closure, significant swelling, whole body hives, severe diarrhea and vomiting, lightheadedness then inject epinephrine and seek immediate medical care afterwards. Action plan in place.   Follow up in 6-12 months or sooner if needed.

## 2021-07-20 ENCOUNTER — Other Ambulatory Visit (HOSPITAL_COMMUNITY): Payer: Self-pay

## 2021-08-08 DIAGNOSIS — Z419 Encounter for procedure for purposes other than remedying health state, unspecified: Secondary | ICD-10-CM | POA: Diagnosis not present

## 2021-08-24 ENCOUNTER — Other Ambulatory Visit (HOSPITAL_BASED_OUTPATIENT_CLINIC_OR_DEPARTMENT_OTHER): Payer: Self-pay

## 2021-08-24 MED FILL — Norethindrone Ace & Ethinyl Estradiol-FE Tab 1.5 MG-30 MCG: ORAL | 84 days supply | Qty: 84 | Fill #2 | Status: AC

## 2021-09-07 DIAGNOSIS — Z419 Encounter for procedure for purposes other than remedying health state, unspecified: Secondary | ICD-10-CM | POA: Diagnosis not present

## 2021-09-29 ENCOUNTER — Other Ambulatory Visit (HOSPITAL_BASED_OUTPATIENT_CLINIC_OR_DEPARTMENT_OTHER): Payer: Self-pay

## 2021-09-29 ENCOUNTER — Other Ambulatory Visit: Payer: Self-pay | Admitting: Family Medicine

## 2021-09-29 MED ORDER — NORETHIN ACE-ETH ESTRAD-FE 1.5-30 MG-MCG PO TABS
1.0000 | ORAL_TABLET | Freq: Every day | ORAL | 0 refills | Status: DC
  Filled 2021-09-29 – 2021-11-24 (×2): qty 84, 84d supply, fill #0

## 2021-09-29 NOTE — Telephone Encounter (Signed)
Requested Prescriptions  Pending Prescriptions Disp Refills   norethindrone-ethinyl estradiol-iron (BLISOVI FE 1.5/30) 1.5-30 MG-MCG tablet 84 tablet 3    Sig: TAKE 1 TABLET BY MOUTH DAILY.     OB/GYN:  Contraceptives Passed - 09/29/2021  3:46 PM      Passed - Last BP in normal range    BP Readings from Last 1 Encounters:  07/12/21 120/70         Passed - Valid encounter within last 12 months    Recent Outpatient Visits          9 months ago Surveillance for birth control, oral contraceptives   [facility] [place], Odette Horns, MD   1 year ago Abnormal uterine bleeding   [facility] [place], Odette Horns, MD   1 year ago Menorrhalgia   [facility] Hoy Register, MD      Future Appointments            In 3 months Pad[place], Pilar Grammes, MD [facility]

## 2021-10-04 ENCOUNTER — Other Ambulatory Visit (HOSPITAL_BASED_OUTPATIENT_CLINIC_OR_DEPARTMENT_OTHER): Payer: Self-pay

## 2021-10-08 DIAGNOSIS — Z419 Encounter for procedure for purposes other than remedying health state, unspecified: Secondary | ICD-10-CM | POA: Diagnosis not present

## 2021-11-08 DIAGNOSIS — Z419 Encounter for procedure for purposes other than remedying health state, unspecified: Secondary | ICD-10-CM | POA: Diagnosis not present

## 2021-11-14 ENCOUNTER — Other Ambulatory Visit (HOSPITAL_BASED_OUTPATIENT_CLINIC_OR_DEPARTMENT_OTHER): Payer: Self-pay

## 2021-11-24 ENCOUNTER — Other Ambulatory Visit (HOSPITAL_BASED_OUTPATIENT_CLINIC_OR_DEPARTMENT_OTHER): Payer: Self-pay

## 2021-12-06 DIAGNOSIS — Z419 Encounter for procedure for purposes other than remedying health state, unspecified: Secondary | ICD-10-CM | POA: Diagnosis not present

## 2021-12-21 ENCOUNTER — Other Ambulatory Visit (HOSPITAL_BASED_OUTPATIENT_CLINIC_OR_DEPARTMENT_OTHER): Payer: Self-pay

## 2022-01-06 DIAGNOSIS — Z419 Encounter for procedure for purposes other than remedying health state, unspecified: Secondary | ICD-10-CM | POA: Diagnosis not present

## 2022-01-10 ENCOUNTER — Ambulatory Visit (INDEPENDENT_AMBULATORY_CARE_PROVIDER_SITE_OTHER): Payer: 59 | Admitting: Allergy

## 2022-01-10 ENCOUNTER — Encounter: Payer: Self-pay | Admitting: Allergy

## 2022-01-10 VITALS — BP 118/74 | Temp 98.6°F | Resp 16 | Ht 64.0 in | Wt 318.2 lb

## 2022-01-10 DIAGNOSIS — J4520 Mild intermittent asthma, uncomplicated: Secondary | ICD-10-CM | POA: Diagnosis not present

## 2022-01-10 DIAGNOSIS — H1013 Acute atopic conjunctivitis, bilateral: Secondary | ICD-10-CM

## 2022-01-10 DIAGNOSIS — J3089 Other allergic rhinitis: Secondary | ICD-10-CM | POA: Diagnosis not present

## 2022-01-10 DIAGNOSIS — T7800XD Anaphylactic reaction due to unspecified food, subsequent encounter: Secondary | ICD-10-CM

## 2022-01-10 NOTE — Patient Instructions (Signed)
Allergic rhinitis ?2019 skin testing positive to dust mites and cats.  May need to update allergy testing in future to see if you have developed additional allergens like seasonal or outdoor allergens ?Continue environmental control measures. ?Continue Allegra (fexofenadine) daily at this time ?Continue Singulair (montelukast) 10mg  daily at night. ?May use Flonase (fluticasone) nasal spray 1 spray per nostril twice a day as needed for nasal congestion.  ?Continue as needed use of your allergy based eyedrop (some OTC options would be Pataday, Alaway or Zaditor) ? ?Asthma ?Daily controller medication(s): Singulair (montelukast) 10mg  daily at night. ?May use albuterol rescue inhaler 2 puffs or nebulizer every 4 to 6 hours as needed for shortness of breath, chest tightness, coughing, and wheezing. May use albuterol rescue inhaler 2 puffs 5 to 15 minutes prior to strenuous physical activities. Monitor frequency of use.  ?Asthma control goals:  ?Full participation in all desired activities (may need albuterol before activity) ?Albuterol use two times or less a week on average (not counting use with activity) ?Cough interfering with sleep two times or less a month ?Oral steroids no more than once a year ?No hospitalizations ? ?Food allergy ?2019 skin testing positive to finned fish. ?Continue strict avoidance of fish. ?For mild symptoms you can take over the counter antihistamines such as Benadryl and monitor symptoms closely. If symptoms worsen or if you have severe symptoms including breathing issues, throat closure, significant swelling, whole body hives, severe diarrhea and vomiting, lightheadedness then inject epinephrine and seek immediate medical care afterwards. ?Action plan in place.  ? ?Follow up in 6-12 months or sooner if needed.  ? ? ?

## 2022-01-10 NOTE — Progress Notes (Signed)
? ? ?Follow-up Note ? ?RE: MARIANNA CID MRN: [id number] DOB: 2000-12-08 ?Date of Office Visit: 01/10/2022 ? ? ?History of present illness: ?Jeanette Hernandez is a 21 y.o. female presenting today for follow-up of allergic rhinitis, asthma, food allergy.  She was last seen in the office on 07/12/21 by myself.   ? ?She states her asthma is doing well and is controlled.  She has only needed to use her albuterol prior to activity.  Otherwise denies daytime or nighttime symptoms.  No ED/UC visits or systemic steroid needs since last visit.  She does continue to take singulair daily.   ? ?She reports this season has been rough with itchy/watery eyes, hoarse voice in mornings, morning congestion.  She did start using allergy eye drop that is OTC as needed and does help.  She is taking allegra and singulair at night.    ? ?She continues to avoid fish.  She has not needed to use her epinephrine device.   ? ?Review of systems: ?Review of Systems  ?Constitutional: Negative.   ?HENT:    ?     See HPI  ?Eyes:   ?     See HPI  ?Respiratory: Negative.    ?Cardiovascular: Negative.   ?Gastrointestinal: Negative.   ?Musculoskeletal: Negative.   ?Skin: Negative.   ?Allergic/Immunologic: Negative.   ?Neurological: Negative.    ? ?All other systems negative unless noted above in HPI ? ?Past medical/social/surgical/family history have been reviewed and are unchanged unless specifically indicated below. ? ?No changes ? ?Medication List: ?Current Outpatient Medications  ?Medication Sig Dispense Refill  ? [facility] ALLERGY 25 MG capsule TAKE 1 CAPSULE BY MOUTH EVERY 6 HOURS FOR 4 DAYS  0  ? EPINEPHrine (EPIPEN 2-PAK) 0.3 mg/0.3 mL IJ SOAJ injection Inject 0.3 mg into the muscle as needed for anaphylaxis. 2 each 2  ? albuterol (PROAIR HFA) 108 (90 Base) MCG/ACT inhaler Inhale 2 puffs into the lungs every 6 (six) hours as needed for wheezing or shortness of breath. (Patient not taking: Reported on 01/10/2022) 1 Inhaler 1  ? albuterol (PROVENTIL) (2.5  MG/3ML) 0.083% nebulizer solution Inhale 3 mLs (2.5 mg total) by nebulization every 4 (four) hours as needed for wheezing or shortness of breath. (Patient not taking: Reported on 01/10/2022) 90 mL 1  ? fexofenadine (ALLEGRA) 180 MG tablet Take 1 tablet (180 mg total) by mouth daily. 30 tablet 5  ? fluticasone (FLONASE) 50 MCG/ACT nasal spray Place 1 spray into both nostrils 2 (two) times daily as needed. 16 g 5  ? ibuprofen (ADVIL,MOTRIN) 800 MG tablet Take 1 tablet (800 mg total) by mouth 3 (three) times daily. 21 tablet 0  ? montelukast (SINGULAIR) 10 MG tablet Take 1 tablet (10 mg total) by mouth at bedtime. 30 tablet 5  ? Multiple Vitamins-Minerals (MULTIVITAMIN PO) Take by mouth.    ? norethindrone-ethinyl estradiol-iron (BLISOVI FE 1.5/30) 1.5-30 MG-MCG tablet TAKE 1 TABLET BY MOUTH DAILY. 84 tablet 0  ? ?No current facility-administered medications for this visit.  ?  ? ?Known medication allergies: ?Allergies  ?Allergen Reactions  ? Fish Allergy Anaphylaxis  ? Sulfa Antibiotics Anaphylaxis  ? Amoxicillin   ? ? ? ?Physical examination: ?Blood pressure 118/74, temperature 98.6 ?F (37 ?C), resp. rate 16, height 5\' 4"  (1.626 m), weight (!) 318 lb 4 oz (144.4 kg). ? ?General: Alert, interactive, in no acute distress. ?HEENT: PERRLA, TMs pearly gray, turbinates non-edematous without discharge, post-pharynx non erythematous. ?Neck: Supple without lymphadenopathy. ?Lungs: Clear to auscultation without  wheezing, rhonchi or rales. {no increased work of breathing. ?CV: Normal S1, S2 without murmurs. ?Abdomen: Nondistended, nontender. ?Skin: Warm and dry, without lesions or rashes. ?Extremities:  No clubbing, cyanosis or edema. ?Neuro:   Grossly intact. ? ?Diagnositics/Labs: ?None today ? ? ?Assessment and plan: ?Allergic rhinitis ?2019 skin testing positive to dust mites and cats.  May need to update allergy testing in future to see if you have developed additional allergens like seasonal or outdoor allergens ?Continue  environmental control measures. ?Continue Allegra (fexofenadine) daily at this time ?Continue Singulair (montelukast) 10mg  daily at night. ?May use Flonase (fluticasone) nasal spray 1 spray per nostril twice a day as needed for nasal congestion.  ?Continue as needed use of your allergy based eyedrop (some OTC options would be Pataday, Alaway or Zaditor) ? ?Asthma ?Daily controller medication(s): Singulair (montelukast) 10mg  daily at night. ?May use albuterol rescue inhaler 2 puffs or nebulizer every 4 to 6 hours as needed for shortness of breath, chest tightness, coughing, and wheezing. May use albuterol rescue inhaler 2 puffs 5 to 15 minutes prior to strenuous physical activities. Monitor frequency of use.  ?Asthma control goals:  ?Full participation in all desired activities (may need albuterol before activity) ?Albuterol use two times or less a week on average (not counting use with activity) ?Cough interfering with sleep two times or less a month ?Oral steroids no more than once a year ?No hospitalizations ? ?Food allergy ?2019 skin testing positive to finned fish. ?Continue strict avoidance of fish. ?For mild symptoms you can take over the counter antihistamines such as Benadryl and monitor symptoms closely. If symptoms worsen or if you have severe symptoms including breathing issues, throat closure, significant swelling, whole body hives, severe diarrhea and vomiting, lightheadedness then inject epinephrine and seek immediate medical care afterwards. ?Action plan in place.  ? ?Follow up in 6-12 months or sooner if needed.  ? ?I appreciate the opportunity to take part in [place] care. Please do not hesitate to contact me with questions. ? ?Sincerely, ? ? ?8-12, MD ?Allergy/Immunology ?[facility] ? ? ?

## 2022-02-05 DIAGNOSIS — Z419 Encounter for procedure for purposes other than remedying health state, unspecified: Secondary | ICD-10-CM | POA: Diagnosis not present

## 2022-02-16 ENCOUNTER — Other Ambulatory Visit: Payer: Self-pay | Admitting: Family Medicine

## 2022-02-16 ENCOUNTER — Ambulatory Visit: Payer: Self-pay | Admitting: *Deleted

## 2022-02-16 ENCOUNTER — Other Ambulatory Visit (HOSPITAL_BASED_OUTPATIENT_CLINIC_OR_DEPARTMENT_OTHER): Payer: Self-pay

## 2022-02-16 MED ORDER — NORETHIN ACE-ETH ESTRAD-FE 1.5-30 MG-MCG PO TABS
1.0000 | ORAL_TABLET | Freq: Every day | ORAL | 0 refills | Status: DC
  Filled 2022-02-16: qty 28, 28d supply, fill #0

## 2022-02-16 NOTE — Telephone Encounter (Signed)
Patient called and advised a courtesy 1 month was sent in today to cover until scheduled appointment. Advised to call the pharmacy to verify receipt and if not there to call us back to resend it. ?

## 2022-02-16 NOTE — Telephone Encounter (Signed)
Patient is requesting RF on her birth control- patient reports she is doing well with her OCP- cycle control is good. She was unaware she needed to schedule an appointment- she has scheduled and she would like Rx filled until her appointment- patient advised we can fill until her appointment. ?Reason for Disposition ? [1] Caller requesting a prescription renewal (no refills left), no triage required, AND [2] triager able to renew prescription per department policy ? ?Answer Assessment - Initial Assessment Questions ?1. DRUG NAME: "What medicine do you need to have refilled?" ?    Birth control pills ?2. REFILLS REMAINING: "How many refills are remaining?" (Note: The label on the medicine or pill bottle will show how many refills are remaining. If there are no refills remaining, then a renewal may be needed.) ?    none ?3. EXPIRATION DATE: "What is the expiration date?" (Note: The label states when the prescription will expire, and thus can no longer be refilled.) ?      ?4. PRESCRIBING HCP: "Who prescribed it?" Reason: If prescribed by specialist, call should be referred to that group. ?    PCP ? ?30 day courtesy RF given ? ?Protocols used: Medication Refill and Renewal Call-A-AH ? ?

## 2022-02-16 NOTE — Addendum Note (Signed)
Addended byWilford Corner on: 02/16/2022 10:04 AM ? ? Modules accepted: Orders ? ?

## 2022-02-16 NOTE — Telephone Encounter (Signed)
Courtesy RF given- patient has scheduled an appointment ?Requested Prescriptions  ?Pending Prescriptions Disp Refills  ?? norethindrone-ethinyl estradiol-iron (BLISOVI FE 1.5/30) 1.5-30 MG-MCG tablet 28 tablet 0  ?  Sig: TAKE 1 TABLET BY MOUTH DAILY.  ?  ? OB/GYN:  Contraceptives Failed - 02/16/2022  8:47 AM  ?  ?  Failed - Valid encounter within last 12 months  ?  Recent Outpatient Visits   ?      ? 1 year ago Surveillance for birth control, oral contraceptives  ? [facility] [place], Odette Horns, MD  ? 1 year ago Abnormal uterine bleeding  ?  [facility] Hoy Register, MD  ? 1 year ago Menorrhalgia  ? [facility] Hoy Register, MD  ?  ?  ?Future Appointments   ?        ? In 3 weeks Sharon Seller, [facility]  ?  ? ?  ?  ?  Passed - Last BP in normal range  ?  BP Readings from Last 1 Encounters:  ?01/10/22 118/74  ?   ?  ?  Passed - Patient is not a smoker  ?  ?  ? ?

## 2022-02-16 NOTE — Telephone Encounter (Signed)
Medication Refill - Medication: norethindrone-ethinyl estradiol-iron (BLISOVI FE 1.5/30) 1.5-30 MG-MCG ?Pt needs this filled today / pt asked if she can get more than 1 refill sent at a time / please advise asap  ? ?Has the patient contacted their pharmacy? Yes.   ?(Agent: If no, request that the patient contact the pharmacy for the refill. If patient does not wish to contact the pharmacy document the reason why and proceed with request.) ?(Agent: If yes, when and what did the pharmacy advise?) ? ?Preferred Pharmacy (with phone number or street name):  ?[facility] Phone:  [phone]  ?Fax:  [phone]  ?  ? ?Has the patient been seen for an appointment in the last year OR does the patient have an upcoming appointment? No ? ? ?Agent: Please be advised that RX refills may take up to 3 business days. We ask that you follow-up with your pharmacy. ?

## 2022-02-16 NOTE — Telephone Encounter (Signed)
Pt called back to ask how many refills the dr sent in. ?I advised the pt no refills sent in, that she needs appt.  Pt states the person she spoke with this morning should have told her that.  ?She said this will throw her off, and we should not mess with someone's birth control. ?I apologized to the pt, and agreed she should have been told she needs appt. ?Pt continued to say how frustrated she is. ? ?Pt has not been seen since 12/06/2020. ? ?Pt would like the nurse to call her back. ?I made appt for the pt first available 03/14/2022. ? ?

## 2022-02-26 ENCOUNTER — Other Ambulatory Visit (HOSPITAL_BASED_OUTPATIENT_CLINIC_OR_DEPARTMENT_OTHER): Payer: Self-pay

## 2022-03-08 DIAGNOSIS — Z419 Encounter for procedure for purposes other than remedying health state, unspecified: Secondary | ICD-10-CM | POA: Diagnosis not present

## 2022-03-14 ENCOUNTER — Other Ambulatory Visit (HOSPITAL_BASED_OUTPATIENT_CLINIC_OR_DEPARTMENT_OTHER): Payer: Self-pay

## 2022-03-14 ENCOUNTER — Encounter: Payer: Self-pay | Admitting: Physician Assistant

## 2022-03-14 ENCOUNTER — Ambulatory Visit: Payer: 59 | Attending: Physician Assistant | Admitting: Physician Assistant

## 2022-03-14 VITALS — BP 124/78 | HR 86 | Temp 98.1°F | Resp 16 | Ht 64.0 in | Wt 315.0 lb

## 2022-03-14 DIAGNOSIS — Z3041 Encounter for surveillance of contraceptive pills: Secondary | ICD-10-CM | POA: Diagnosis not present

## 2022-03-14 MED ORDER — NORETHIN ACE-ETH ESTRAD-FE 1.5-30 MG-MCG PO TABS
1.0000 | ORAL_TABLET | Freq: Every day | ORAL | 11 refills | Status: DC
Start: 2022-03-14 — End: 2023-02-12
  Filled 2022-03-14: qty 84, 84d supply, fill #0
  Filled 2022-05-29: qty 84, 84d supply, fill #1
  Filled 2022-08-27: qty 84, 84d supply, fill #2
  Filled 2022-11-22: qty 84, 84d supply, fill #3

## 2022-03-14 NOTE — Progress Notes (Signed)
Follow up OCP  Needs refill on OCP. Has not missed cycle or pill

## 2022-03-14 NOTE — Patient Instructions (Signed)
Oral Contraception Information Oral contraceptive pills (OCPs) are medicines taken by mouth to prevent pregnancy. They work by: Preventing the ovaries from releasing eggs. Thickening mucus in the lower part of the uterus (cervix). This prevents sperm from entering the uterus. Thinning the lining of the uterus (endometrium). This prevents a fertilized egg from attaching to the endometrium. OCPs are highly effective when taken exactly as prescribed. However, OCPs do not prevent STIs (sexually transmitted infections). Using condoms while on an OCP can help prevent STIs. What happens before starting OCPs? Before you start taking OCPs: You may have a physical exam, blood test, and Pap test. Your health care provider will make sure you are a good candidate for oral contraception. OCPs are not a good option for certain women, such as: Women who smoke and are older than age 35. Women who have or have had certain conditions, such as: A history of high blood pressure. Deep vein thrombosis. Pulmonary embolism. Stroke. Cardiovascular disease. Peripheral vascular disease. Ask your health care provider about the possible side effects of the OCP you may be prescribed. Be aware that it can take 2-3 months for your body to adjust to changes in hormone levels. Types of oral contraception  Birth control pills contain the hormones estrogen and progestin (synthetic progesterone) or progestin only. The combination pill This type of pill contains estrogen and progestin hormones. Conventional contraception pills come in packs of 21 or 28 pills. Some packs with 28-day pills contain estrogen and progestin for the first 21-24 days. Hormone-free tablets, called placebos, are taken for the final 4-7 days. You should have menstrual bleeding during the time you take the placebos. In packs with 21 tablets, you take no pills for 7 days. Menstrual bleeding occurs during these days. (Some people prefer taking a pill for 28  days to help establish a routine). Extended-interval contraception pills come in packs of 91 pills. The first 84 tablets have both estrogen and progestin. The last 7 pills are placebos. Menstrual bleeding occurs during the placebo days. With this schedule, menstrual bleeding happens once every 3 months. Continuous contraception pills come in packs of 28 pills. All pills in the pack contain estrogen and progestin. With this schedule, regular menstrual bleeding does not happen, but there may be spotting or irregular bleeding. Progestin-only pills This type of pill is often called the mini-pill and contains the progestin hormone only. It comes in packs of 28 pills. In some packs, the last 4 pills are placebos. The pill must be taken at the same time every day. This is very important to prevent pregnancy. Menstrual bleeding may not be regular or predictable. What are the advantages? Oral contraception provides reliable and continuous contraception if taken as directed. It may treat or decrease symptoms of: Menstrual period cramps. Irregular menstrual cycle or bleeding. Heavy menstrual flow. Abnormal uterine bleeding. Acne, depending on the type of pill. Polycystic ovarian syndrome (POS). Endometriosis. Iron deficiency anemia. Premenstrual symptoms, including severe irritability, depression, or anxiety. It also may: Reduce the risk of endometrial and ovarian cancer. Be used as emergency contraception. Prevent ectopic pregnancies and infections of the fallopian tubes. What can make OCPs less effective? OCPs may be less effective if: You forget to take the pill every day. For progestin-only pills, it is especially important to take the pill at the same time each day. Even taking it 3 hours late can increase the risk of pregnancy. You have a stomach or intestinal disease that reduces your body's ability to absorb the pill.   You take OCPs with other medicines that make OCPs less effective, such as  antibiotics, certain HIV medicines, and some seizure medicines. You take expired OCPs. You forget to restart the pill after 7 days of not taking it. This refers to the packs of 21 pills. What are the side effects and risks? OCPs can sometimes cause side effects, such as: Headache. Depression. Trouble sleeping. Nausea and vomiting. Breast tenderness. Irregular bleeding or spotting during the first several months. Bloating or fluid retention. Increase in blood pressure. Combination pills may slightly increase the risk of: Blood clots. Heart attack. Stroke. Follow these instructions at home: Follow instructions from your health care provider about how to start taking your first cycle of OCPs. Depending on when you start the pill, you may need to use a backup form of birth control, such as condoms, during the first week. Make sure you know what steps to take if you forget to take the pill. Summary Oral contraceptive pills (OCPs) are medicines taken by mouth to prevent pregnancy. They are highly effective when taken exactly as prescribed. OCPs contain a combination of the hormones estrogen and progestin (synthetic progesterone) or progestin only. Before you start taking the pill, you may have a physical exam, blood test, and Pap test. Your health care provider will make sure you are a good candidate for oral contraception. The combination pill may come in a 21-day pack, a 28-day pack, or a 91-day pack. Progestin-only pills come in packs of 28 pills. OCPs can sometimes cause side effects, such as headache, nausea, breast tenderness, or irregular bleeding. This information is not intended to replace advice given to you by your health care provider. Make sure you discuss any questions you have with your health care provider. Document Revised: 06/24/2020 Document Reviewed: 06/02/2020 Elsevier Patient Education  2023 Elsevier Inc.  

## 2022-03-14 NOTE — Progress Notes (Signed)
Patient ID: Jeanette Hernandez, female   DOB: 2001-01-10, 21 y.o.   MRN: [id number]   Jeanette Hernandez, is a 21 y.o. female  GEZ:[id number]  [id number]  DOB - [birth year]  Chief Complaint  Patient presents with   Contraception       Subjective:   Jeanette Hernandez is a 21 y.o. female here today for [facility] pill refill.  Doing well.  She does note some mild PMS and mood swings prior to week of menses but not interested in SSRI for this.  Periods light and regular.  Non-smoker.  Also uses condoms.  No problems updated.  ALLERGIES: Allergies  Allergen Reactions   Fish Allergy Anaphylaxis   Sulfa Antibiotics Anaphylaxis   Amoxicillin     PAST MEDICAL HISTORY: Past Medical History:  Diagnosis Date   Asthma    Eczema    as a child   Urticaria     MEDICATIONS AT HOME: Prior to Admission medications   Medication Sig Start Date End Date Taking? Authorizing Provider  albuterol (PROAIR HFA) 108 (90 Base) MCG/ACT inhaler Inhale 2 puffs into the lungs every 6 (six) hours as needed for wheezing or shortness of breath. 11/14/17  Yes Bobbitt, Heywood Iles, MD  albuterol (PROVENTIL) (2.5 MG/3ML) 0.083% nebulizer solution Inhale 3 mLs (2.5 mg total) by nebulization every 4 (four) hours as needed for wheezing or shortness of breath. 01/10/21  Yes Ellamae Sia, DO  [facility] ALLERGY 25 MG capsule TAKE 1 CAPSULE BY MOUTH EVERY 6 HOURS FOR 4 DAYS 10/26/17  Yes [provider]  EPINEPHrine (EPIPEN 2-PAK) 0.3 mg/0.3 mL IJ SOAJ injection Inject 0.3 mg into the muscle as needed for anaphylaxis. 01/10/21  Yes Ellamae Sia, DO  fexofenadine (ALLEGRA) 180 MG tablet Take 1 tablet (180 mg total) by mouth daily. 02/03/20  Yes Padgett, Pilar Grammes, MD  fluticasone [facility]) 50 MCG/ACT nasal spray Place 1 spray into both nostrils 2 (two) times daily as needed. 01/10/21  Yes Ellamae Sia, DO  montelukast (SINGULAIR) 10 MG tablet Take 1 tablet (10 mg total) by mouth at bedtime. 07/12/21  Yes Marcelyn Bruins, MD   Multiple Vitamins-Minerals (MULTIVITAMIN PO) Take by mouth.   Yes [provider]  ibuprofen (ADVIL,MOTRIN) 800 MG tablet Take 1 tablet (800 mg total) by mouth 3 (three) times daily. Patient not taking: Reported on 03/14/2022 09/19/17   Roxy Horseman, PA-C  norethindrone-ethinyl estradiol-iron (BLISOVI FE 1.5/30) 1.5-30 MG-MCG tablet TAKE 1 TABLET BY MOUTH DAILY. 03/14/22 03/14/23  Anders Simmonds, PA-C    ROS: Neg HEENT Neg resp Neg cardiac Neg GI Neg GU Neg MS Neg psych Neg neuro  Objective:   Vitals:   03/14/22 0853  BP: 124/78  Pulse: 86  Resp: 16  Temp: 98.1 F (36.7 C)  TempSrc: Oral  SpO2: 100%  Weight: (!) 315 lb (142.9 kg)  Height: 5\' 4"  (1.626 m)   Exam General appearance : Awake, alert, not in any distress. Speech Clear. Not toxic looking HEENT: Atraumatic and Normocephalic  Chest: Good air entry bilaterally, CTAB.  No rales/rhonchi/wheezing CVS: S1 S2 regular, no murmurs.  Extremities: B/L Lower Ext shows no edema, both legs are warm to touch Neurology: Awake alert, and oriented X 3, CN II-XII intact, Non focal Skin: No Rash  Data Review Lab Results  Component Value Date   HGBA1C 5.5 07/13/2020    Assessment & Plan   1. Family planning, BCP (birth control pills) maintenance - norethindrone-ethinyl estradiol-iron (BLISOVI FE 1.5/30) 1.5-30 MG-MCG tablet;  TAKE 1 TABLET BY MOUTH DAILY.  Dispense: 28 tablet; Refill: 11    Return in about 1 year (around 03/15/2023) for PCP;  sooner if needed.  The patient was given clear instructions to go to ER or return to medical center if symptoms don't improve, worsen or new problems develop. The patient verbalized understanding. The patient was told to call to get lab results if they haven't heard anything in the next week.      Freeman Caldron, PA-C [facility] and [facility] [place], [place]   03/14/2022, 9:18 AM

## 2022-03-16 ENCOUNTER — Other Ambulatory Visit: Payer: Self-pay | Admitting: Allergy

## 2022-03-16 ENCOUNTER — Telehealth: Payer: Self-pay | Admitting: Allergy

## 2022-03-16 ENCOUNTER — Other Ambulatory Visit (HOSPITAL_BASED_OUTPATIENT_CLINIC_OR_DEPARTMENT_OTHER): Payer: Self-pay

## 2022-03-16 MED ORDER — MONTELUKAST SODIUM 10 MG PO TABS
10.0000 mg | ORAL_TABLET | Freq: Every day | ORAL | 5 refills | Status: DC
  Filled 2022-03-16 – 2022-04-04 (×2): qty 30, 30d supply, fill #0
  Filled 2022-05-29: qty 30, 30d supply, fill #1
  Filled 2022-07-13: qty 30, 30d supply, fill #2

## 2022-03-16 NOTE — Telephone Encounter (Signed)
Refill sent in as pt was seen in april

## 2022-03-16 NOTE — Telephone Encounter (Signed)
Patient is requesting a refill on Montelukast sent to [facility] on [address].

## 2022-03-30 ENCOUNTER — Other Ambulatory Visit (HOSPITAL_BASED_OUTPATIENT_CLINIC_OR_DEPARTMENT_OTHER): Payer: Self-pay

## 2022-04-04 ENCOUNTER — Other Ambulatory Visit (HOSPITAL_BASED_OUTPATIENT_CLINIC_OR_DEPARTMENT_OTHER): Payer: Self-pay

## 2022-04-07 DIAGNOSIS — Z419 Encounter for procedure for purposes other than remedying health state, unspecified: Secondary | ICD-10-CM | POA: Diagnosis not present

## 2022-05-08 DIAGNOSIS — Z419 Encounter for procedure for purposes other than remedying health state, unspecified: Secondary | ICD-10-CM | POA: Diagnosis not present

## 2022-05-21 ENCOUNTER — Telehealth: Payer: Self-pay | Admitting: Family Medicine

## 2022-05-21 ENCOUNTER — Ambulatory Visit: Payer: 59 | Attending: Family Medicine | Admitting: Family Medicine

## 2022-05-21 ENCOUNTER — Encounter: Payer: Self-pay | Admitting: Family Medicine

## 2022-05-21 ENCOUNTER — Encounter: Payer: Self-pay | Admitting: Emergency Medicine

## 2022-05-21 VITALS — BP 101/71 | HR 77 | Temp 98.2°F | Resp 16 | Ht 64.0 in | Wt 307.0 lb

## 2022-05-21 DIAGNOSIS — Z1159 Encounter for screening for other viral diseases: Secondary | ICD-10-CM | POA: Diagnosis not present

## 2022-05-21 DIAGNOSIS — Z13228 Encounter for screening for other metabolic disorders: Secondary | ICD-10-CM | POA: Diagnosis not present

## 2022-05-21 DIAGNOSIS — F32A Depression, unspecified: Secondary | ICD-10-CM | POA: Diagnosis not present

## 2022-05-21 DIAGNOSIS — F419 Anxiety disorder, unspecified: Secondary | ICD-10-CM

## 2022-05-21 DIAGNOSIS — Z131 Encounter for screening for diabetes mellitus: Secondary | ICD-10-CM

## 2022-05-21 NOTE — Telephone Encounter (Signed)
She is interested in counseling for anxiety and depression.  Thank you

## 2022-05-21 NOTE — Progress Notes (Signed)
Subjective:  Patient ID: Jeanette Hernandez, female    DOB: [birth year]  Age: 21 y.o. MRN: [id number]  CC: Follow-up (Seeking a referral for intermittent depressive episode lasting 3-4 months //Has tried to call behavioral health but no return calls)   HPI Jeanette Hernandez is a 21 y.o. year old female with a history of asthma, menorrhagia here for an office visit.  Interval History: Menorrhagia has improved on her OCP and she does not have any cramps but she has noticed she does have premenstrual syndrome with low moods.  She would like to talk to a therapist as she has a mixture of anxiety and depression and it can be things from her past or present.  Denies suicidal ideations or intent. She tried to call around for counseling options but was unsuccessful. She is not open to medications but would like counseling. She exercises regularly by weight lifting and walking on alternate days of the week. Past Medical History:  Diagnosis Date   Asthma    Eczema    as a child   Urticaria     History reviewed. No pertinent surgical history.  Family History  Problem Relation Age of Onset   Asthma Father     Social History   Socioeconomic History   Marital status: Single    Spouse name: Not on file   Number of children: Not on file   Years of education: Not on file   Highest education level: Not on file  Occupational History   Not on file  Tobacco Use   Smoking status: Never    Passive exposure: Yes   Smokeless tobacco: Never  Vaping Use   Vaping Use: Never used  Substance and Sexual Activity   Alcohol use: No    Alcohol/week: 0.0 standard drinks of alcohol   Drug use: No   Sexual activity: Never    Birth control/protection: Abstinence  Other Topics Concern   Not on file  Social History Narrative   Not on file   Social Determinants of Health   Financial Resource Strain: Not on file  Food Insecurity: Not on file  Transportation Needs: Not on file  Physical Activity: Not  on file  Stress: Not on file  Social Connections: Not on file    Allergies  Allergen Reactions   Fish Allergy Anaphylaxis   Sulfa Antibiotics Anaphylaxis   Amoxicillin     Outpatient Medications Prior to Visit  Medication Sig Dispense Refill   albuterol (PROAIR HFA) 108 (90 Base) MCG/ACT inhaler Inhale 2 puffs into the lungs every 6 (six) hours as needed for wheezing or shortness of breath. 1 Inhaler 1   albuterol (PROVENTIL) (2.5 MG/3ML) 0.083% nebulizer solution Inhale 3 mLs (2.5 mg total) by nebulization every 4 (four) hours as needed for wheezing or shortness of breath. 90 mL 1   EPINEPHrine (EPIPEN 2-PAK) 0.3 mg/0.3 mL IJ SOAJ injection Inject 0.3 mg into the muscle as needed for anaphylaxis. 2 each 2   fexofenadine (ALLEGRA) 180 MG tablet Take 1 tablet (180 mg total) by mouth daily. 30 tablet 5   fluticasone (FLONASE) 50 MCG/ACT nasal spray Place 1 spray into both nostrils 2 (two) times daily as needed. 16 g 5   montelukast (SINGULAIR) 10 MG tablet Take 1 tablet (10 mg total) by mouth at bedtime. 30 tablet 5   Multiple Vitamins-Minerals (MULTIVITAMIN PO) Take by mouth.     norethindrone-ethinyl estradiol-iron (BLISOVI FE 1.5/30) 1.5-30 MG-MCG tablet TAKE 1 TABLET BY MOUTH DAILY. [place]  tablet 11   [facility] ALLERGY 25 MG capsule TAKE 1 CAPSULE BY MOUTH EVERY 6 HOURS FOR 4 DAYS (Patient not taking: Reported on 05/21/2022)  0   ibuprofen (ADVIL,MOTRIN) 800 MG tablet Take 1 tablet (800 mg total) by mouth 3 (three) times daily. (Patient not taking: Reported on 03/14/2022) 21 tablet 0   No facility-administered medications prior to visit.     ROS Review of Systems  Constitutional:  Negative for activity change and appetite change.  HENT:  Negative for sinus pressure and sore throat.   Respiratory:  Negative for chest tightness, shortness of breath and wheezing.   Cardiovascular:  Negative for chest pain and palpitations.  Gastrointestinal:  Negative for abdominal distention, abdominal pain and  constipation.  Genitourinary: Negative.   Musculoskeletal: Negative.   Psychiatric/Behavioral:  Negative for behavioral problems and dysphoric mood.     Objective:  BP 101/71 (BP Location: Right Wrist, Patient Position: Sitting, Cuff Size: Large)   Pulse 77   Temp 98.2 F (36.8 C)   Resp 16   Ht 5' 4"  (1.626 m)   Wt (!) 307 lb (139.3 kg)   SpO2 98%   BMI 52.70 kg/m      05/21/2022    3:58 PM 03/14/2022    8:53 AM 01/10/2022   10:40 AM  BP/Weight  Systolic BP 177 939 030  Diastolic BP 71 78 74  Wt. (Lbs) 307 315 318.25  BMI 52.7 kg/m2 54.07 kg/m2 54.63 kg/m2      Physical Exam Constitutional:      Appearance: She is well-developed.  Cardiovascular:     Rate and Rhythm: Normal rate.     Heart sounds: Normal heart sounds. No murmur heard. Pulmonary:     Effort: Pulmonary effort is normal.     Breath sounds: Normal breath sounds. No wheezing or rales.  Chest:     Chest wall: No tenderness.  Abdominal:     General: Bowel sounds are normal. There is no distension.     Palpations: Abdomen is soft. There is no mass.     Tenderness: There is no abdominal tenderness.  Musculoskeletal:        General: Normal range of motion.     Right lower leg: No edema.     Left lower leg: No edema.  Neurological:     Mental Status: She is alert and oriented to person, place, and time.  Psychiatric:        Mood and Affect: Mood normal.        Latest Ref Rng & Units 07/13/2020    2:43 PM 09/18/2017    8:46 PM  CMP  Glucose 65 - 99 mg/dL 66  114   BUN 6 - 20 mg/dL 10  8   Creatinine 0.57 - 1.00 mg/dL 0.65  0.61   Sodium 134 - 144 mmol/L 138  135   Potassium 3.5 - 5.2 mmol/L 3.9  3.2   Chloride 96 - 106 mmol/L 99  104   CO2 20 - 29 mmol/L 23  25   Calcium 8.7 - 10.2 mg/dL 8.8  8.6     Lipid Panel  No results found for: "CHOL", "TRIG", "HDL", "CHOLHDL", "VLDL", "LDLCALC", "LDLDIRECT"  CBC    Component Value Date/Time   WBC 10.1 07/13/2020 1443   WBC 10.9 (H) 09/18/2017  2046   RBC 4.69 07/13/2020 1443   RBC 4.56 09/18/2017 2046   HGB 11.3 07/13/2020 1443   HCT 35.3 07/13/2020 1443   PLT 418 07/13/2020  1443   MCV 75 (L) 07/13/2020 1443   MCH 24.1 (L) 07/13/2020 1443   MCH 23.0 (L) 09/18/2017 2046   MCHC 32.0 07/13/2020 1443   MCHC 31.5 09/18/2017 2046   RDW 16.0 (H) 07/13/2020 1443   LYMPHSABS 2.5 07/13/2020 1443   EOSABS 0.2 07/13/2020 1443   BASOSABS 0.0 07/13/2020 1443    Lab Results  Component Value Date   HGBA1C 5.5 07/13/2020       05/21/2022    4:03 PM 03/14/2022    8:56 AM 12/06/2020    4:26 PM 09/06/2020    3:38 PM 07/13/2020    2:04 PM  Depression screen PHQ 2/9  Decreased Interest 2 0 0 0 0  Down, Depressed, Hopeless 2 0 0 0 0  PHQ - 2 Score 4 0 0 0 0  Altered sleeping 0 0 0 0 0  Tired, decreased energy 0 1 0 0 0  Change in appetite 0 0 0 0 0  Feeling bad or failure about yourself  2 0 0 0 0  Trouble concentrating 0 0 0 0 0  Moving slowly or fidgety/restless 0 0 0 0 0  Suicidal thoughts 0 0 0 0 0  PHQ-9 Score 6 1 0 0 0        05/21/2022    4:04 PM 03/14/2022    8:56 AM 12/06/2020    4:26 PM 09/06/2020    3:38 PM  GAD 7 : Generalized Anxiety Score  Nervous, Anxious, on Edge 1 0 0 0  Control/stop worrying 0 0 0 0  Worry too much - different things 0 0 0 0  Trouble relaxing 0 0 0 0  Restless 0 0 0 0  Easily annoyed or irritable 0 0 0 0  Afraid - awful might happen 0 0 0 0  Total GAD 7 Score 1 0 0 0     Assessment & Plan:  1. Anxiety and depression GAD-7 score of 1, PHQ-9 score of 6 She is not open to pharmacotherapy We will send message to the LCSW in-house to reach out to her regarding counseling sessions  2. Screening for metabolic disorder - TAV69+VXYI - CBC with Differential/Platelet  3. Screening for viral disease - HCV Ab w Reflex to Quant PCR - HIV Antibody (routine testing w rflx)  4. Screening for diabetes mellitus - Hemoglobin A1c   Return in about 9 months (around 02/19/2023) for Contraceptive  surveillance.      Charlott Rakes, MD, FAAFP. [facility] and [place], [place]   05/21/2022, 4:05 PM

## 2022-05-22 ENCOUNTER — Other Ambulatory Visit: Payer: Self-pay | Admitting: Family Medicine

## 2022-05-22 DIAGNOSIS — R718 Other abnormality of red blood cells: Secondary | ICD-10-CM

## 2022-05-22 LAB — CMP14+EGFR
ALT: 18 IU/L (ref 0–32)
AST: 14 IU/L (ref 0–40)
Albumin/Globulin Ratio: 1.5 (ref 1.2–2.2)
Albumin: 4.2 g/dL (ref 4.0–5.0)
Alkaline Phosphatase: 73 IU/L (ref 44–121)
BUN/Creatinine Ratio: 10 (ref 9–23)
BUN: 8 mg/dL (ref 6–20)
Bilirubin Total: <0.2 mg/dL (ref 0.0–1.2)
CO2: 20 mmol/L (ref 20–29)
Calcium: 9.5 mg/dL (ref 8.7–10.2)
Chloride: 109 mmol/L — ABNORMAL HIGH (ref 96–106)
Creatinine, Ser: 0.81 mg/dL (ref 0.57–1.00)
Globulin, Total: 2.8 g/dL (ref 1.5–4.5)
Glucose: 83 mg/dL (ref 70–99)
Potassium: 4.4 mmol/L (ref 3.5–5.2)
Sodium: 145 mmol/L — ABNORMAL HIGH (ref 134–144)
Total Protein: 7 g/dL (ref 6.0–8.5)
eGFR: 105 mL/min/{1.73_m2} (ref >59)

## 2022-05-22 LAB — CBC WITH DIFFERENTIAL/PLATELET
Basophils Absolute: 0 10*3/uL (ref 0.0–0.2)
Basos: 0 %
EOS (ABSOLUTE): 0.2 10*3/uL (ref 0.0–0.4)
Eos: 2 %
Hematocrit: 37.5 % (ref 34.0–46.6)
Hemoglobin: 12 g/dL (ref 11.1–15.9)
Immature Grans (Abs): 0 10*3/uL (ref 0.0–0.1)
Immature Granulocytes: 0 %
Lymphocytes Absolute: 2.2 10*3/uL (ref 0.7–3.1)
Lymphs: 22 %
MCH: 23.5 pg — ABNORMAL LOW (ref 26.6–33.0)
MCHC: 32 g/dL (ref 31.5–35.7)
MCV: 73 fL — ABNORMAL LOW (ref 79–97)
Monocytes Absolute: 0.5 10*3/uL (ref 0.1–0.9)
Monocytes: 5 %
Neutrophils Absolute: 7.2 10*3/uL — ABNORMAL HIGH (ref 1.4–7.0)
Neutrophils: 71 %
Platelets: 427 10*3/uL (ref 150–450)
RBC: 5.11 x10E6/uL (ref 3.77–5.28)
RDW: 16 % — ABNORMAL HIGH (ref 11.7–15.4)
WBC: 10.1 10*3/uL (ref 3.4–10.8)

## 2022-05-22 LAB — HEMOGLOBIN A1C
Est. average glucose Bld gHb Est-mCnc: 111 mg/dL
Hgb A1c MFr Bld: 5.5 % (ref 4.8–5.6)

## 2022-05-22 LAB — HCV AB W REFLEX TO QUANT PCR: HCV Ab: NONREACTIVE

## 2022-05-22 LAB — HCV INTERPRETATION

## 2022-05-22 LAB — HIV ANTIBODY (ROUTINE TESTING W REFLEX): HIV Screen 4th Generation wRfx: NONREACTIVE

## 2022-05-25 ENCOUNTER — Telehealth: Payer: Self-pay | Admitting: Family Medicine

## 2022-05-25 ENCOUNTER — Ambulatory Visit: Payer: Self-pay | Admitting: *Deleted

## 2022-05-25 NOTE — Telephone Encounter (Signed)
  Chief Complaint: Left arm hurting from blood draw done on Monday. Symptoms: Having sharp pain from needle site down to wrist.  Movement also is very painful.   Just to touch the puncture site hurts.   Mild swelling.  No redness.  It hurt when her blood was being drawn a sharp pain. Frequency: Since Monday Pertinent Negatives: Patient denies it getting any better. Disposition: [] ED /[x] Urgent Care (no appt availability in office) / [] Appointment(In office/virtual)/ []  [place] [facility]/ [] Home Care/ [] Refused Recommended Disposition /[] [facility] [place] [facility]/ []  Follow-up with PCP Additional Notes: Pt told me at end of conversation that she was driving out of town as we were talking.   I looked for an appt. At [facility] and wellness and there aren't any until Oct into Nov. 2023.    "I will give it the weekend and see how it does".   "I'll be back in town Sunday evening so I'll go to the urgent care if it's not better by then".   I instructed her to go to the ED locally if the pain became worse, numbness/tingling occurred before getting back into town.   Pt was agreeable to this plan.   I put her on the wait list in case of a cancellation.

## 2022-05-25 NOTE — Telephone Encounter (Signed)
Message from [employer] sent at 05/25/2022 10:18 AM EDT  Summary: left arm discomfort   The patient has called to share that they're continuing to experience left arm discomfort since their blood was drawn on Monday at their office visit   The patient shares that they've noticed a sharp pain at the point of insertion   The patient would like to speak with a member of staff further when possible           Call History   Type Contact Phone/Fax User  05/25/2022 10:15 AM EDT Phone (Incoming) Aanyah, Loa (Self) [phone] Judie Petit) Coley, Everette A   Reason for Disposition  Localized pain, redness or hard lump along vein    Severe pain from site of blood draw in left arm  Answer Assessment - Initial Assessment Questions 1. ONSET: "When did the pain start?"     Monday I had some blood drawn and it's been hurting so bad since. 2. LOCATION: "Where is the pain located?"     Left arm at needle site.   When I move the arm I feel a sharp pain down to my wrist.   I felt pain in it last night while at work.   I could not lift weights last night due to pain.   It feels like a stab. 3. PAIN: "How bad is the pain?" (Scale 1-10; or mild, moderate, severe)   - MILD (1-3): Doesn't interfere with normal activities.   - MODERATE (4-7): Interferes with normal activities (e.g., work or school) or awakens from sleep.   - SEVERE (8-10): Excruciating pain, unable to do any normal activities, unable to hold a cup of water.     Severe pain with movement down to wrist. 4. WORK OR EXERCISE: "Has there been any recent work or exercise that involved this part of the body?"     Blood draw 5. CAUSE: "What do you think is causing the arm pain?"     Needle hit a nerve or tendon during the blood draw.   It's a mildly  swollen.    My tourniquet was very tight.   She did not have trouble getting the blood.   Even during the draw I felt a sharp pain.    6. OTHER SYMPTOMS: "Do you have any other symptoms?" (e.g.,  neck pain, swelling, rash, fever, numbness, weakness)     No  7. PREGNANCY: "Is there any chance you are pregnant?" "When was your last menstrual period?"     Not asked  Protocols used: Arm Pain-A-AH

## 2022-05-25 NOTE — Telephone Encounter (Signed)
Has done hot compresses along with Tylenol intermittently. Still has sharp pain worsening.  Will f/u with UC if she has time or will f/u next week.

## 2022-05-25 NOTE — Telephone Encounter (Signed)
Copied from CRM [id number]. Topic: Referral - Question >> May 25, 2022 10:14 AM Everette C wrote: Reason for CRM: The patient has called to follow up on their previous request for a referral to speak with C. Margo Aye regarding counseling   Please contact the patient further when possible

## 2022-05-29 ENCOUNTER — Other Ambulatory Visit (HOSPITAL_BASED_OUTPATIENT_CLINIC_OR_DEPARTMENT_OTHER): Payer: Self-pay

## 2022-06-08 DIAGNOSIS — Z419 Encounter for procedure for purposes other than remedying health state, unspecified: Secondary | ICD-10-CM | POA: Diagnosis not present

## 2022-06-13 ENCOUNTER — Ambulatory Visit: Payer: Medicaid Other | Attending: Licensed Clinical Social Worker | Admitting: Licensed Clinical Social Worker

## 2022-06-13 DIAGNOSIS — F4323 Adjustment disorder with mixed anxiety and depressed mood: Secondary | ICD-10-CM

## 2022-06-13 NOTE — BH Specialist Note (Signed)
Integrated Behavioral Health Initial In-Person Visit  MRN: [id number] Name: MAYLEEN BORRERO  Number of Integrated Behavioral Health Clinician visits: 1- Initial Visit  Session Start time: 0915    Session End time: 1029  Total time in minutes: 74   Types of Service: Individual psychotherapy  Interpretor:No.    Warm Hand Off Completed.     Subjective: AKIYAH EPPOLITO is a 21 y.o. female accompanied by  herself Patient was referred by PCP for anxiety and depression. Patient reports the following symptoms/concerns: often feeling down, and not feeling heard, low self confidence. Duration of problem: all her life but more so about a year ago; Severity of problem: mild  Objective: Mood: Euphoric and Affect: Appropriate Risk of harm to self or others: No plan to harm self or others  Life Context: Family and Social: Patient lives with her mother and mothers long term boyfriend. Patient has friends and hangs out with them sometimes.  School/Work: Patient works at a [employer] and really enjoys it and wants to go to school and do more in that area. Self-Care: working out Life Changes: Patient was bullied a lot in high school this has affected her a lot and she had a panic attack almost a year ago.   Patient and/or Family's Strengths/Protective Factors: Social connections, Social and Patent attorney, Concrete supports in place (healthy food, safe environments, etc.), Sense of purpose, and Physical Health (exercise, healthy diet, medication compliance, etc.)  Goals Addressed: Patient will: Reduce symptoms of: anxiety and depression Increase knowledge and/or ability of: coping skills and healthy habits  Demonstrate ability to: Increase motivation to adhere to plan of care  Progress towards Goals: Ongoing  Interventions: Interventions utilized: CBT Cognitive Behavioral Therapy -Challenge Unhelpful Thoughts. The first step involved in challenging unhelpful  thoughts is to become aware of them. ... Engage In Self-Care. ... Recognise Your Strengths. ... Set Yourself A Goal. Standardized Assessments completed: GAD-7 and PHQ 9  Patient and/or Family Response: Patient states that she had a panic attack about a year ago. Her and her dad have bumped heads a lot over the years, and she feels that he causes stress and anxiety on her. Patient stated that often her mother is not very understanding of what she is going through and feels that she shouldn't feel a certain way (such as sad or depressed). Patient shared that she uses to be bullied a lot in middle and high school and was fought often. Her peers would pick on her weight. Patient reports she often over thinks about things and worries that she may not be making her mother proud. When she argues with her mother, the patient gets sad and shuts down. She notices she feels like this often in disagreements with anyone. Patience states she would like to be heard, not be cut off and to be talked to and leisured about minimizing her feelings.  Patient Centered Plan: Patient is on the following Treatment Plan(s):  Anxiety and Depression  Assessment: Patient currently experiencing over-thinking, feeling as though she has or will let others down, often sad, and low self confidence .   Patient may benefit from continued support form [facility], ongoing counseling, using coping skills discussed, having a conversation with her mother about what she needs from her now that she is a young adult.      06/13/2022    2:35 PM 05/21/2022    4:03 PM 03/14/2022    8:56 AM 12/06/2020    4:26 PM 09/06/2020  3:38 PM  Depression screen PHQ 2/9  Decreased Interest 0 2 0 0 0  Down, Depressed, Hopeless 1 2 0 0 0  PHQ - 2 Score 1 4 0 0 0  Altered sleeping 1 0 0 0 0  Tired, decreased energy 1 0 1 0 0  Change in appetite 0 0 0 0 0  Feeling bad or failure about yourself  1 2 0 0 0  Trouble concentrating 0 0 0 0 0  Moving slowly or  fidgety/restless 0 0 0 0 0  Suicidal thoughts 0 0 0 0 0  PHQ-9 Score 4 6 1  0 0  Difficult doing work/chores Not difficult at all          06/13/2022    2:35 PM 05/21/2022    4:04 PM 03/14/2022    8:56 AM 12/06/2020    4:26 PM  GAD 7 : Generalized Anxiety Score  Nervous, Anxious, on Edge 1 1 0 0  Control/stop worrying 1 0 0 0  Worry too much - different things 1 0 0 0  Trouble relaxing 0 0 0 0  Restless 0 0 0 0  Easily annoyed or irritable 0 0 0 0  Afraid - awful might happen 0 0 0 0  Total GAD 7 Score 3 1 0 0  Anxiety Difficulty Not difficult at all      Plan: Follow up with behavioral health clinician on : In 4 weeks Behavioral recommendations: Changing her diet, juicing 3x a week, fasting, reading and rehearsing affirmations from her affirmations jar to build her confidence. Continuing to exercise.  Referral(s): Integrated 02/05/2021 (In Clinic) "From scale of 1-10, how likely are you to follow plan?": most likely   [employer], Vassie Loll

## 2022-06-20 NOTE — Patient Instructions (Signed)
Writer with Patient

## 2022-07-08 DIAGNOSIS — Z419 Encounter for procedure for purposes other than remedying health state, unspecified: Secondary | ICD-10-CM | POA: Diagnosis not present

## 2022-07-11 ENCOUNTER — Ambulatory Visit: Payer: Medicaid Other | Attending: Licensed Clinical Social Worker | Admitting: Licensed Clinical Social Worker

## 2022-07-11 DIAGNOSIS — F4323 Adjustment disorder with mixed anxiety and depressed mood: Secondary | ICD-10-CM

## 2022-07-11 NOTE — BH Specialist Note (Signed)
Integrated Behavioral Health Follow Up In-Person Visit  MRN: [id number] Name: Jeanette Hernandez  Number of [place] Clinician visits: 2- Second Visit  Session Start time: 2297   Session End time: 9892  Total time in minutes: 39   Types of Service: Individual psychotherapy  Interpretor:No.   Subjective: Jeanette Hernandez is a 21 y.o. female accompanied by  herself. Patient was referred by PCP for anxiety and sadness. Patient reports the following symptoms/concerns: worrying a lot, sadness and self doubt Duration of problem: about a year ago; Severity of problem: mild  Objective: Mood: Euthymic and Affect: Appropriate and a little flat Risk of harm to self or others: No plan to harm self or others  Life Context: Family and Social: lives with mother School/Work: currently working at nights Self-Care: reading affirmations and working out Life Changes: Patient was bullied a lot in high school this has affected her a lot and she had a panic attack almost a year ago.  Patient and/or Family's Strengths/Protective Factors: Social and Emotional competence, Concrete supports in place (healthy food, safe environments, etc.), and Physical Health (exercise, healthy diet, medication compliance, etc.)  Goals Addressed: Patient will:  Reduce symptoms of: anxiety and depression   Increase knowledge and/or ability of: coping skills and healthy habits   Demonstrate ability to: Increase healthy adjustment to current life circumstances  Progress towards Goals: Ongoing  Interventions: Interventions utilized:  Mindfulness or Psychologist, educational and CBT Cognitive Behavioral Therapy Standardized Assessments completed: PHQ-SADS    07/11/2022   11:01 AM 06/13/2022    2:35 PM 05/21/2022    4:04 PM  PHQ-SADS Last 3 Score only  Total GAD-7 Score 3 3 1   PHQ Adolescent Score 8 4     Patient and/or Family Response: Patient reported her and mother have had some issues with her mother  understanding her sadness and symptoms of anxiety and depression at times. Her and her mother had a long talk recently that went well. Patient reports still having days where she struggles to feel good about herself and motivated. Patient shared some things that have been working for her.  Patient Centered Plan: Patient is on the following Treatment Plan(s): anxiety and depression   Assessment: Patient currently experiencing decreased in sadness but still worrying a lot lately and will often walk away or shut down..   Patient may benefit from continued support with [place]. Adjusting her diet, reading, continuing with her affirmations, exercise and writing out what she is feeling when she is getting overwhelmed with her worries. .  Plan: Follow up with behavioral health clinician on : In 4 weeks Behavioral recommendations: adjusting her diet, reading, continuing with her affirmations, exercise and writing out what she is feeling when she is getting overwhelmed with her worries. .  Referral(s): [place] (In Clinic) "From scale of 1-10, how likely are you to follow plan?":most likely   Jeanette Hernandez, [place]

## 2022-07-11 NOTE — BH Specialist Note (Deleted)
Integrated Behavioral Health Follow Up In-Person Visit  MRN: [id number] Name: CHAVY AVERA  Number of [place] Clinician visits: 1- Initial Visit  Session Start time: 0915   Session End time: 6295  Total time in minutes: 74   Types of Service: {CHL AMB TYPE OF SERVICE:[phone]}  Interpretor:{yes [id number]} Interpretor Name and Language: ***  Subjective: OMEKA HOLBEN is a 21 y.o. female accompanied by {Patient accompanied by:[phone]} Patient was referred by *** for ***. Patient reports the following symptoms/concerns: *** Duration of problem: ***; Severity of problem: {Mild/Moderate/Severe:20260}  Objective: Mood: {BHH MOOD:22306} and Affect: {BHH AFFECT:22307} Risk of harm to self or others: {CHL AMB BH Suicide Current Mental Status:21022748}  Life Context: Family and Social: *** School/Work: *** Self-Care: *** Life Changes: ***  Patient and/or Family's Strengths/Protective Factors: {CHL AMB BH PROTECTIVE FACTORS:[id number]}  Goals Addressed: Patient will:  Reduce symptoms of: {IBH Symptoms:21014056}   Increase knowledge and/or ability of: {IBH Patient Tools:21014057}   Demonstrate ability to: {IBH Goals:21014053}  Progress towards Goals: {CHL AMB BH PROGRESS TOWARDS [phone]}  Interventions: Interventions utilized:  {IBH Interventions:21014054} Standardized Assessments completed: {IBH Screening Tools:21014051}  Patient and/or Family Response: ***  Patient Centered Plan: Patient is on the following Treatment Plan(s): *** Assessment: Patient currently experiencing ***.   Patient may benefit from ***.  Plan: Follow up with behavioral health clinician on : *** Behavioral recommendations: *** Referral(s): {IBH Referrals:21014055} "From scale of 1-10, how likely are you to follow plan?": ***  Shann Medal, LCSW

## 2022-07-13 ENCOUNTER — Other Ambulatory Visit (HOSPITAL_BASED_OUTPATIENT_CLINIC_OR_DEPARTMENT_OTHER): Payer: Self-pay

## 2022-08-08 DIAGNOSIS — Z419 Encounter for procedure for purposes other than remedying health state, unspecified: Secondary | ICD-10-CM | POA: Diagnosis not present

## 2022-08-09 ENCOUNTER — Encounter: Payer: Self-pay | Admitting: Physician Assistant

## 2022-08-09 ENCOUNTER — Telehealth: Payer: Self-pay

## 2022-08-09 ENCOUNTER — Ambulatory Visit: Payer: 59 | Attending: Physician Assistant | Admitting: Physician Assistant

## 2022-08-09 VITALS — BP 112/75 | HR 74 | Ht 64.0 in | Wt 314.0 lb

## 2022-08-09 DIAGNOSIS — N943 Premenstrual tension syndrome: Secondary | ICD-10-CM

## 2022-08-09 DIAGNOSIS — Z23 Encounter for immunization: Secondary | ICD-10-CM

## 2022-08-09 DIAGNOSIS — R5383 Other fatigue: Secondary | ICD-10-CM

## 2022-08-09 DIAGNOSIS — R718 Other abnormality of red blood cells: Secondary | ICD-10-CM

## 2022-08-09 NOTE — Patient Instructions (Signed)
Premenstrual Syndrome Premenstrual syndrome (PMS) is a group of physical, emotional, and behavioral symptoms that affect women as part of their menstrual cycle. PMS occurs 1-2 weeks before the start of a woman's menstrual period and goes away a few days after menstrual bleeding begins. PMS can range from mild to severe. What are the causes? The exact cause of this condition is not known, but it seems to be related to hormone changes that happen before menstruation. What are the signs or symptoms? Symptoms of this condition often happen every month. They go away after your period starts. Physical symptoms of this condition include: Bloating. Breast pain or tenderness. Headaches. Extreme fatigue. Backaches. Swelling of the hands and feet. Weight gain. Hot flashes. Emotional symptoms of this condition include: Mood swings. Depression. Angry or hostile outbursts. Irritability. Anxiety. Crying spells. Behavioral symptoms include: Food cravings or appetite changes. Changes in sexual desire. Confusion. Social withdrawal. Poor concentration. How is this diagnosed? This condition may be diagnosed based on a history of your symptoms. This condition is generally diagnosed if symptoms of PMS: Are present in the 5 days before your period starts. End within 4 days after your period starts. Happen at least 3 months in a row. Interfere with some of your normal activities. Other conditions that can cause some of these symptoms must be ruled out before PMS can be diagnosed. These include depression, anxiety, anemia, and thyroid problems. How is this treated? This condition may be treated by doing the following: Maintaining a healthy lifestyle. This includes eating a well-balanced diet and exercising regularly. Taking over-the-counter medicines that can help relieve symptoms, such as cramps, aches, pain, headaches, and breast tenderness. Follow these instructions at home: Eating and  drinking  Eat a well-balanced diet. Avoid caffeine and alcohol. Limit the amount of salt and salty foods you eat. This will help reduce bloating. Drink enough fluid to keep your urine pale yellow. Take a multivitamin if told to do so by your health care provider. Lifestyle  Do not use any products that contain nicotine or tobacco. These products include cigarettes, chewing tobacco, and vaping devices, such as e-cigarettes. If you need help quitting, ask your health care provider. Exercise regularly as suggested by your health care provider. Get enough sleep. For most adults, this is 7-8 hours of sleep each night. Practice relaxation techniques, such as yoga, tai chi, or meditation. Find healthy ways to manage stress. General instructions  For 2-3 months, write down your symptoms, whether they are mild to severe, and how long they last. This will help your health care provider choose the best treatment for you. Take over-the-counter and prescription medicines only as told by your health care provider. If you are using birth control pills (oral contraceptives), use them as told by your health care provider. Contact a health care provider if: Your symptoms get worse. You develop new symptoms. You have trouble doing your daily activities. Summary Premenstrual syndrome (PMS) is a group of physical, emotional, and behavioral symptoms that affect women as part of their menstrual cycle. PMS starts 1-2 weeks before the start of a woman's period and goes away a few days after the period starts. PMS is treated by maintaining a healthy lifestyle and taking medicines to relieve the symptoms. This information is not intended to replace advice given to you by your health care provider. Make sure you discuss any questions you have with your health care provider. Document Revised: 05/13/2020 Document Reviewed: 05/13/2020 Elsevier Patient Education  2023 Elsevier Inc.  

## 2022-08-09 NOTE — Telephone Encounter (Signed)
Called patient -DOB verfied - advised 6 mth f/u appt needed for medication refills.  Appt scheduled for 08/10/22 @ 10 am w/ Dr. Nelva Bush.  Patient  verbalized understanding, no further questions.

## 2022-08-09 NOTE — Telephone Encounter (Signed)
Patient called to request an Albuterol Inhaler.   [place]

## 2022-08-09 NOTE — Progress Notes (Signed)
Patient ID: Jeanette Hernandez, female   DOB: [birth year], 21 y.o.   MRN: [id number]   Jeanette Hernandez, is a 21 y.o. female  [id number]  [id number]  DOB - 01/27/2001  Chief Complaint  Patient presents with   Fatigue       Subjective:   Jeanette Hernandez is a 21 y.o. female here today for fatigue that seems worse just before her period.  Also gets moody and irritable.  She specifically wants her iron levels checked.  She is consistent with her BCP.  Works from 530pm til 1 am so sleep schedule can be erratic  No problems updated.  ALLERGIES: Allergies  Allergen Reactions   Fish Allergy Anaphylaxis   Sulfa Antibiotics Anaphylaxis   Amoxicillin     PAST MEDICAL HISTORY: Past Medical History:  Diagnosis Date   Asthma    Eczema    as a child   Urticaria     MEDICATIONS AT HOME: Prior to Admission medications   Medication Sig Start Date End Date Taking? Authorizing Provider  albuterol (PROAIR HFA) 108 (90 Base) MCG/ACT inhaler Inhale 2 puffs into the lungs every 6 (six) hours as needed for wheezing or shortness of breath. 11/14/17  Yes Bobbitt, Sedalia Muta, MD  albuterol (PROVENTIL) (2.5 MG/3ML) 0.083% nebulizer solution Inhale 3 mLs (2.5 mg total) by nebulization every 4 (four) hours as needed for wheezing or shortness of breath. 01/10/21  Yes Garnet Sierras, DO  [facility] ALLERGY 25 MG capsule  10/26/17  Yes [provider]  EPINEPHrine (EPIPEN 2-PAK) 0.3 mg/0.3 mL IJ SOAJ injection Inject 0.3 mg into the muscle as needed for anaphylaxis. 01/10/21  Yes Garnet Sierras, DO  fexofenadine (ALLEGRA) 180 MG tablet Take 1 tablet (180 mg total) by mouth daily. 02/03/20  Yes Padgett, Rae Halsted, MD  fluticasone [facility]) 50 MCG/ACT nasal spray Place 1 spray into both nostrils 2 (two) times daily as needed. 01/10/21  Yes Garnet Sierras, DO  ibuprofen (ADVIL,MOTRIN) 800 MG tablet Take 1 tablet (800 mg total) by mouth 3 (three) times daily. 09/19/17  Yes Montine Circle, PA-C  montelukast (SINGULAIR)  10 MG tablet Take 1 tablet (10 mg total) by mouth at bedtime. 03/16/22  Yes Padgett, Rae Halsted, MD  Multiple Vitamins-Minerals (MULTIVITAMIN PO) Take by mouth.   Yes [provider]  norethindrone-ethinyl estradiol-iron (BLISOVI FE 1.5/30) 1.5-30 MG-MCG tablet TAKE 1 TABLET BY MOUTH DAILY. 03/14/22 03/14/23 Yes Storm Sovine, Dionne Bucy, PA-C    ROS: Neg HEENT Neg resp Neg cardiac Neg GI Neg GU Neg MS Neg psych Neg neuro  Objective:   Vitals:   08/09/22 1411  BP: 112/75  Pulse: 74  SpO2: 100%  Weight: 214 lb 9.6 oz (97.3 kg)  Height: 5\' 4"  (1.626 m)  Weight is 314 NOT 215 Exam General appearance : Awake, alert, not in any distress. Speech Clear. Not toxic looking HEENT: Atraumatic and Normocephalic Neck: Supple, no JVD. No cervical lymphadenopathy.  Chest: Good air entry bilaterally, CTAB.  No rales/rhonchi/wheezing CVS: S1 S2 regular, no murmurs.  Extremities: B/L Lower Ext shows no edema, both legs are warm to touch Neurology: Awake alert, and oriented X 3, CN II-XII intact, Non focal Skin: No Rash  Data Review Lab Results  Component Value Date   HGBA1C 5.5 05/21/2022   HGBA1C 5.5 07/13/2020    Assessment & Plan   1. Fatigue, unspecified type Sleep hygiene reviewed - CBC with Differential/Platelet - Iron, TIBC and Ferritin Panel - TSH  2. Microcytosis Release labs per  Dr Alvis Lemmings - Iron, TIBC and Ferritin Panel - TSH  3. PMS (premenstrual syndrome) Patient education provided.  She wishes to use herbs at this time.  Consider SSRI - TSH  Flu shot given  Return if symptoms worsen or fail to improve.  The patient was given clear instructions to go to ER or return to medical center if symptoms don't improve, worsen or new problems develop. The patient verbalized understanding. The patient was told to call to get lab results if they haven't heard anything in the next week.      Georgian Co, PA-C [facility]  [place], [place] [phone]   08/09/2022, 2:23 PM

## 2022-08-10 ENCOUNTER — Encounter: Payer: Self-pay | Admitting: Allergy

## 2022-08-10 ENCOUNTER — Other Ambulatory Visit (HOSPITAL_BASED_OUTPATIENT_CLINIC_OR_DEPARTMENT_OTHER): Payer: Self-pay

## 2022-08-10 ENCOUNTER — Ambulatory Visit (INDEPENDENT_AMBULATORY_CARE_PROVIDER_SITE_OTHER): Payer: 59 | Admitting: Allergy

## 2022-08-10 VITALS — BP 138/70 | HR 103 | Temp 98.2°F | Resp 16 | Ht 63.58 in | Wt 315.0 lb

## 2022-08-10 DIAGNOSIS — J4520 Mild intermittent asthma, uncomplicated: Secondary | ICD-10-CM | POA: Diagnosis not present

## 2022-08-10 DIAGNOSIS — H1013 Acute atopic conjunctivitis, bilateral: Secondary | ICD-10-CM | POA: Diagnosis not present

## 2022-08-10 DIAGNOSIS — T7800XD Anaphylactic reaction due to unspecified food, subsequent encounter: Secondary | ICD-10-CM

## 2022-08-10 DIAGNOSIS — J3089 Other allergic rhinitis: Secondary | ICD-10-CM

## 2022-08-10 LAB — CBC WITH DIFFERENTIAL/PLATELET
Basophils Absolute: 0 10*3/uL (ref 0.0–0.2)
Basos: 0 %
EOS (ABSOLUTE): 0.1 10*3/uL (ref 0.0–0.4)
Eos: 2 %
Hematocrit: 38.1 % (ref 34.0–46.6)
Hemoglobin: 11.6 g/dL (ref 11.1–15.9)
Immature Grans (Abs): 0 10*3/uL (ref 0.0–0.1)
Immature Granulocytes: 0 %
Lymphocytes Absolute: 2.2 10*3/uL (ref 0.7–3.1)
Lymphs: 24 %
MCH: 22.7 pg — ABNORMAL LOW (ref 26.6–33.0)
MCHC: 30.4 g/dL — ABNORMAL LOW (ref 31.5–35.7)
MCV: 75 fL — ABNORMAL LOW (ref 79–97)
Monocytes Absolute: 0.4 10*3/uL (ref 0.1–0.9)
Monocytes: 4 %
Neutrophils Absolute: 6.2 10*3/uL (ref 1.4–7.0)
Neutrophils: 70 %
Platelets: 424 10*3/uL (ref 150–450)
RBC: 5.1 x10E6/uL (ref 3.77–5.28)
RDW: 14.5 % (ref 11.7–15.4)
WBC: 8.9 10*3/uL (ref 3.4–10.8)

## 2022-08-10 LAB — IRON,TIBC AND FERRITIN PANEL
Ferritin: 73 ng/mL (ref 15–150)
Iron Saturation: 31 % (ref 15–55)
Iron: 100 ug/dL (ref 27–159)
Total Iron Binding Capacity: 325 ug/dL (ref 250–450)
UIBC: 225 ug/dL (ref 131–425)

## 2022-08-10 LAB — TSH: TSH: 1.53 u[IU]/mL (ref 0.450–4.500)

## 2022-08-10 MED ORDER — ALBUTEROL SULFATE HFA 108 (90 BASE) MCG/ACT IN AERS
2.0000 | INHALATION_SPRAY | Freq: Four times a day (QID) | RESPIRATORY_TRACT | 1 refills | Status: DC | PRN
  Filled 2022-08-10: qty 18, 28d supply, fill #0

## 2022-08-10 MED ORDER — ALBUTEROL SULFATE (2.5 MG/3ML) 0.083% IN NEBU
2.5000 mg | INHALATION_SOLUTION | RESPIRATORY_TRACT | 1 refills | Status: DC | PRN
  Filled 2022-08-10: qty 75, 5d supply, fill #0
  Filled 2022-12-18: qty 75, 5d supply, fill #1

## 2022-08-10 MED ORDER — EPINEPHRINE 0.3 MG/0.3ML IJ SOAJ
0.3000 mg | INTRAMUSCULAR | 2 refills | Status: DC | PRN
  Filled 2022-08-10: qty 2, 30d supply, fill #0
  Filled 2022-08-27 – 2022-09-24 (×2): qty 2, 30d supply, fill #1

## 2022-08-10 MED ORDER — MONTELUKAST SODIUM 10 MG PO TABS
10.0000 mg | ORAL_TABLET | Freq: Every day | ORAL | 5 refills | Status: DC
  Filled 2022-08-10: qty 30, 30d supply, fill #0
  Filled 2022-09-24: qty 30, 30d supply, fill #1
  Filled 2022-11-22: qty 30, 30d supply, fill #2
  Filled 2022-12-28 – 2023-01-10 (×2): qty 30, 30d supply, fill #3
  Filled 2023-02-12 (×2): qty 30, 30d supply, fill #4

## 2022-08-10 NOTE — Progress Notes (Unsigned)
Follow-up Note  RE: Jeanette Hernandez MRN: [id number] DOB: 07-Aug-2001 Date of Office Visit: 08/10/2022   History of present illness: Jeanette Hernandez is a 21 y.o. female presenting today for follow-up of allergic rhinitis, asthma, food allergy.  She was last seen in the office on 01/10/22 by myself.  She has not had any major health changes, surgeries or hospitalizations since last visit.  She has been working out more and can tell differences in her tone.  She is doing weight lifting and cardio activities.  She does not need to use albuterol with activities. She thinks she used albuterol once since last visit more so for preventative purpose around her birthday as she had a trip planned and people around her were sick and she did not want to get sick.  She does note nasal drainage with the changes in the season.  She can have coughing in the morning with the drainage.  She states the allegra and singulair are still helpful.  She does take singulair daily during fall and as needed other times of the year.  She does take allegra daily all year and she can tell a difference if she misses a dose.  She does use flonase and does feel it helps some with the drainage.   She has stayed away from fish.  She has not needed to use her epipen and believes it is expires.     Review of systems: Review of Systems  Constitutional: Negative.   HENT:         See HPI  Eyes: Negative.   Respiratory: Negative.    Cardiovascular: Negative.   Gastrointestinal: Negative.   Musculoskeletal: Negative.   Skin: Negative.   Allergic/Immunologic: Negative.   Neurological: Negative.      All other systems negative unless noted above in HPI  Past medical/social/surgical/family history have been reviewed and are unchanged unless specifically indicated below.  No changes  Medication List: Current Outpatient Medications  Medication Sig Dispense Refill   [facility] ALLERGY 25 MG capsule   0   fexofenadine (ALLEGRA) 180 MG  tablet Take 1 tablet (180 mg total) by mouth daily. 30 tablet 5   fluticasone (FLONASE) 50 MCG/ACT nasal spray Place 1 spray into both nostrils 2 (two) times daily as needed. 16 g 5   ibuprofen (ADVIL,MOTRIN) 800 MG tablet Take 1 tablet (800 mg total) by mouth 3 (three) times daily. 21 tablet 0   Multiple Vitamins-Minerals (MULTIVITAMIN PO) Take by mouth.     norethindrone-ethinyl estradiol-iron (BLISOVI FE 1.5/30) 1.5-30 MG-MCG tablet TAKE 1 TABLET BY MOUTH DAILY. 28 tablet 11   albuterol (PROVENTIL) (2.5 MG/3ML) 0.083% nebulizer solution Inhale 3 mLs (2.5 mg total) by nebulization every 4 (four) hours as needed for wheezing or shortness of breath. 75 mL 1   albuterol (VENTOLIN HFA) 108 (90 Base) MCG/ACT inhaler Inhale 2 puffs into the lungs every 6 (six) hours as needed for wheezing or shortness of breath. 18 g 1   EPINEPHrine (EPIPEN 2-PAK) 0.3 mg/0.3 mL IJ SOAJ injection Inject 0.3 mg into the muscle as needed for anaphylaxis. 2 each 2   montelukast (SINGULAIR) 10 MG tablet Take 1 tablet (10 mg total) by mouth at bedtime. 30 tablet 5   No current facility-administered medications for this visit.     Known medication allergies: Allergies  Allergen Reactions   Fish Allergy Anaphylaxis   Sulfa Antibiotics Anaphylaxis   Amoxicillin      Physical examination: Blood pressure 138/70, pulse Marland Kitchen)  103, temperature 98.2 F (36.8 C), temperature source Temporal, resp. rate 16, height 5' 3.58" (1.615 m), weight (!) 315 lb (142.9 kg), SpO2 100 %.  General: Alert, interactive, in no acute distress. HEENT: PERRLA, TMs pearly gray, turbinates non-edematous without discharge, post-pharynx non erythematous. Neck: Supple without lymphadenopathy. Lungs: Clear to auscultation without wheezing, rhonchi or rales. {no increased work of breathing. CV: Normal S1, S2 without murmurs. Abdomen: Nondistended, nontender. Skin: Warm and dry, without lesions or rashes. Extremities:  No clubbing, cyanosis or  edema. Neuro:   Grossly intact.  Diagnositics/Labs: None today  Assessment and plan:   Allergic rhinitis Continue avoidance measures for mites and cats.   Continue Allegra (fexofenadine) daily at this time Continue Singulair (montelukast) 10mg  daily at night. Use Ryaltris nasal spray 2 sprays each nostril twice a day as needed for nasal drainage/congestion.  Let me know how this works for your drainage.  Hold Flonase while using Ryaltris May use Flonase (fluticasone) nasal spray 1 spray per nostril twice a day as needed for nasal congestion.  Continue as needed use of your allergy based eyedrop (some OTC options would be Pataday, Alaway or Zaditor)  Asthma Daily controller medication(s): Singulair (montelukast) 10mg  daily at night. May use albuterol rescue inhaler 2 puffs or nebulizer every 4 to 6 hours as needed for shortness of breath, chest tightness, coughing, and wheezing. May use albuterol rescue inhaler 2 puffs 5 to 15 minutes prior to strenuous physical activities. Monitor frequency of use.  Asthma control goals:  Full participation in all desired activities (may need albuterol before activity) Albuterol use two times or less a week on average (not counting use with activity) Cough interfering with sleep two times or less a month Oral steroids no more than once a year No hospitalizations  Food allergy Continue strict avoidance of fish. For mild symptoms you can take over the counter antihistamines such as Benadryl and monitor symptoms closely. If symptoms worsen or if you have severe symptoms including breathing issues, throat closure, significant swelling, whole body hives, severe diarrhea and vomiting, lightheadedness then inject epinephrine and seek immediate medical care afterwards. Action plan in place.   Follow up in 6-12 months or sooner if needed.   I appreciate the opportunity to take part in [place] care. Please do not hesitate to contact me with  questions.  Sincerely,   Prudy Feeler, MD Allergy/Immunology [facility] [place]

## 2022-08-10 NOTE — Patient Instructions (Addendum)
Allergic rhinitis Continue avoidance measures for mites and cats.   Continue Allegra (fexofenadine) daily at this time Continue Singulair (montelukast) 10mg  daily at night. Use Ryaltris nasal spray 2 sprays each nostril twice a day as needed for nasal drainage/congestion.  Let me know how this works for your drainage.  Hold Flonase while using Ryaltris May use Flonase (fluticasone) nasal spray 1 spray per nostril twice a day as needed for nasal congestion.  Continue as needed use of your allergy based eyedrop (some OTC options would be Pataday, Alaway or Zaditor)  Asthma Daily controller medication(s): Singulair (montelukast) 10mg  daily at night. May use albuterol rescue inhaler 2 puffs or nebulizer every 4 to 6 hours as needed for shortness of breath, chest tightness, coughing, and wheezing. May use albuterol rescue inhaler 2 puffs 5 to 15 minutes prior to strenuous physical activities. Monitor frequency of use.  Asthma control goals:  Full participation in all desired activities (may need albuterol before activity) Albuterol use two times or less a week on average (not counting use with activity) Cough interfering with sleep two times or less a month Oral steroids no more than once a year No hospitalizations  Food allergy Continue strict avoidance of fish. For mild symptoms you can take over the counter antihistamines such as Benadryl and monitor symptoms closely. If symptoms worsen or if you have severe symptoms including breathing issues, throat closure, significant swelling, whole body hives, severe diarrhea and vomiting, lightheadedness then inject epinephrine and seek immediate medical care afterwards. Action plan in place.   Follow up in 6-12 months or sooner if needed.

## 2022-08-13 ENCOUNTER — Encounter: Payer: Self-pay | Admitting: *Deleted

## 2022-08-13 ENCOUNTER — Telehealth: Payer: Self-pay | Admitting: *Deleted

## 2022-08-13 DIAGNOSIS — R718 Other abnormality of red blood cells: Secondary | ICD-10-CM

## 2022-08-13 NOTE — Telephone Encounter (Signed)
Jeanette Hernandez with [facility] states they are unable to process the lab test ordered for Beta Thalassemia HBB unless patient has a PMH/ FH and a family testing available.   If she does not, the lab test that needs to be ordered in Beta Thalassemia Full Gene Sequencing.   [facility]- [phone], option 1 and option 3 Reference number - [id number]

## 2022-08-14 LAB — REQUEST PROBLEM

## 2022-08-14 NOTE — Telephone Encounter (Signed)
New order has been placed. 

## 2022-08-15 ENCOUNTER — Ambulatory Visit: Payer: Medicaid Other | Attending: Licensed Clinical Social Worker | Admitting: Licensed Clinical Social Worker

## 2022-08-15 DIAGNOSIS — F4323 Adjustment disorder with mixed anxiety and depressed mood: Secondary | ICD-10-CM | POA: Diagnosis not present

## 2022-08-15 NOTE — Telephone Encounter (Signed)
Jeanette Hernandez with [facility] advised of new order.

## 2022-08-15 NOTE — BH Specialist Note (Signed)
Integrated Behavioral Health Follow Up In-Person Visit  MRN: [id number] Name: ARLETHA MARSCHKE  Number of Integrated Behavioral Health Clinician visits: 3- Third Visit  Session Start time: 1020   Session End time: 1128  Total time in minutes: 68   Types of Service: Individual psychotherapy  Interpretor:No.   Subjective: KRISTALYN BERGSTRESSER is a 21 y.o. female accompanied by  herself. Patient was referred by PCP for anxiety and sadness. Patient reports the following symptoms/concerns: worrying a lot, sadness and self doubt Duration of problem: about a year ago; Severity of problem: mild   Objective: Mood: Euthymic and Affect: Appropriate and a little flat Risk of harm to self or others: No plan to harm self or others   Life Context: Family and Social: lives with mother School/Work: currently working at nights Self-Care: reading affirmations and working out Life Changes: Patient was bullied a lot in high school this has affected her a lot and she had a panic attack almost a year ago.  Patient and/or Family's Strengths/Protective Factors: Social and Emotional competence, Concrete supports in place (healthy food, safe environments, etc.), and Physical Health (exercise, healthy diet, medication compliance, etc.)  Goals Addressed: Patient will:  Reduce symptoms of: anxiety and depression   Increase knowledge and/or ability of: self-management skills   Demonstrate ability to: Increase healthy adjustment to current life circumstances  Progress towards Goals: Revised  Interventions: Interventions utilized:  CBT Cognitive Behavioral Therapy Standardized Assessments completed: GAD-7 and PHQ 9    08/15/2022    4:10 PM 08/09/2022    2:13 PM 08/09/2022    2:12 PM  PHQ-SADS Last 3 Score only  Total GAD-7 Score 3 4   PHQ Adolescent Score 2  2    Patient and/or Family Response:  Patient reports that her symptoms of anxiety and depression has been reduced a lot in the past few weeks. She  states that her and her mother have a better communication and she is seeing herself process her emotions better.  Patient Centered Plan: Patient is on the following Treatment Plan(s): Anxiety and Depression  Assessment: Patient currently experiencing feeling very little energy, not sleeping well, still often irritated.   Patient may benefit from continued sessions with [facility] LCSWA to maintain coping skills and create more if needed.  Plan: Follow up with behavioral health clinician on : 4 weeks Behavioral recommendations: continued journaling, affirmations, exercising  Referral(s): Integrated [facility] (In Clinic) "From scale of 1-10, how likely are you to follow plan?": most likely   Vassie Loll, [place]

## 2022-08-21 LAB — BETA-THALASSEMIA: HBB (FULL GENE SEQUENCING)

## 2022-08-22 LAB — ALPHA-THALASSEMIA GENOTYPR

## 2022-08-24 ENCOUNTER — Other Ambulatory Visit: Payer: Self-pay

## 2022-08-24 ENCOUNTER — Ambulatory Visit (HOSPITAL_COMMUNITY)
Admission: EM | Admit: 2022-08-24 | Discharge: 2022-08-24 | Disposition: A | Discharge status: Home / Self Care | Payer: 59 | Attending: Family Medicine | Admitting: Family Medicine

## 2022-08-24 ENCOUNTER — Encounter (HOSPITAL_COMMUNITY): Payer: Self-pay | Admitting: *Deleted

## 2022-08-24 ENCOUNTER — Ambulatory Visit: Payer: Self-pay

## 2022-08-24 ENCOUNTER — Ambulatory Visit (INDEPENDENT_AMBULATORY_CARE_PROVIDER_SITE_OTHER): Payer: 59

## 2022-08-24 DIAGNOSIS — M79645 Pain in left finger(s): Secondary | ICD-10-CM | POA: Diagnosis not present

## 2022-08-24 DIAGNOSIS — S6992XA Unspecified injury of left wrist, hand and finger(s), initial encounter: Secondary | ICD-10-CM

## 2022-08-24 DIAGNOSIS — S61215A Laceration without foreign body of left ring finger without damage to nail, initial encounter: Secondary | ICD-10-CM | POA: Diagnosis not present

## 2022-08-24 MED ORDER — BACITRACIN ZINC 500 UNIT/GM EX OINT
TOPICAL_OINTMENT | CUTANEOUS | Status: AC
  Filled 2022-08-24: qty 13.5

## 2022-08-24 NOTE — Telephone Encounter (Signed)
  Chief Complaint: Left ring finger crushed  Symptoms: pain, purple Frequency: 15 minutes ago Pertinent Negatives: Patient denies  Disposition: [] ED /[x] Urgent Care (no appt availability in office) / [] Appointment(In office/virtual)/ []  [facility]/ [] Home Care/ [] Refused Recommended Disposition /[] [facility]/ []  Follow-up with PCP Additional Notes: PT was adjusting the seat on leg machine at the gym. She was having trouble getting it to move and so added her right hand. When seat moved it moved very quickly . Her left ring finger wash caught between the seat and the machine. Finger is purple and swollen and very painful.    Reason for Disposition  [1] SEVERE pain AND [2] not improved 2 hours after pain medicine/ice packs  Answer Assessment - Initial Assessment Questions 1. MECHANISM: "How did the injury happen?"      Left ring - seat on leg press machine crushed finger 2. ONSET: "When did the injury happen?" (Minutes or hours ago)      10-15 minutes ago 3. LOCATION: "What part of the finger is injured?" "Is the nail damaged?"      Left ring finger - nail not damaged 4. APPEARANCE of the INJURY: "What does the injury look like?"      Puple 5. SEVERITY: "Can you use the hand normally?"  "Can you bend your fingers into a ball and then fully open them?"     Can move the finger, but it hurts 6. SIZE: For cuts, bruises, or swelling, ask: "How large is it?" (e.g., inches or centimeters;  entire finger)      The whole finger 7. PAIN: "Is there pain?" If Yes, ask: "How bad is the pain?"    (e.g., Scale 1-10; or mild, moderate, severe)  - NONE (0): no pain.  - MILD (1-3): doesn't interfere with normal activities.   - MODERATE (4-7): interferes with normal activities or awakens from sleep.  - SEVERE (8-10): excruciating pain, unable to hold a glass of water or bend finger even a little.     12/10 8. TETANUS: For any breaks in the skin, ask: "When was the last tetanus  booster?"    Skin is broken unsure when last tetanus shot was 9. OTHER SYMPTOMS: "Do you have any other symptoms?"     pain 10. PREGNANCY: "Is there any chance you are pregnant?" "When was your last menstrual period?"  Protocols used: Finger Injury-A-AH

## 2022-08-24 NOTE — ED Provider Notes (Signed)
[facility]    CSN: [id number] Arrival date & time: 08/24/22  1419      History   Chief Complaint Chief Complaint  Patient presents with   Finger Injury    HPI Jeanette Hernandez is a 21 y.o. female.  Presents with finger injury that occurred today She was at the gym, equipment pinched her on the left ring finger Bleeding at first but now controlled.  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 No pertinent surgical history.  OB History   No obstetric history on file.      Home Medications    Prior to Admission medications   Medication Sig Start Date End Date Taking? Authorizing Provider  albuterol (PROVENTIL) (2.5 MG/3ML) 0.083% nebulizer solution Inhale 3 mLs (2.5 mg total) by nebulization every 4 (four) hours as needed for wheezing or shortness of breath. 08/10/22   Marcelyn Bruins, MD  albuterol (VENTOLIN HFA) 108 (90 Base) MCG/ACT inhaler Inhale 2 puffs into the lungs every 6 (six) hours as needed for wheezing or shortness of breath. 08/10/22   Marcelyn Bruins, MD  [facility] 25 MG capsule  10/26/17   [provider]  EPINEPHrine (EPIPEN 2-PAK) 0.3 mg/0.3 mL IJ SOAJ injection Inject 0.3 mg into the muscle as needed for anaphylaxis. 08/10/22   Marcelyn Bruins, MD  fexofenadine (ALLEGRA) 180 MG tablet Take 1 tablet (180 mg total) by mouth daily. 02/03/20   Marcelyn Bruins, MD  fluticasone (FLONASE) 50 MCG/ACT nasal spray Place 1 spray into both nostrils  2 (two) times daily as needed. 01/10/21   Ellamae Sia, DO  ibuprofen (ADVIL,MOTRIN) 800 MG tablet Take 1 tablet (800 mg total) by mouth 3 (three) times daily. 09/19/17   Roxy Horseman, PA-C  montelukast (SINGULAIR) 10 MG tablet Take 1 tablet (10 mg total) by mouth at bedtime. 08/10/22   Marcelyn Bruins, MD  Multiple Vitamins-Minerals (MULTIVITAMIN PO) Take by mouth.    [provider]  norethindrone-ethinyl estradiol-iron (BLISOVI FE 1.5/30) 1.5-30 MG-MCG tablet TAKE 1 TABLET BY MOUTH DAILY. 03/14/22 03/14/23  Anders Simmonds, PA-C    Family History Family History  Problem Relation Age of Onset   Asthma Father     Social History Social History   Tobacco Use   Smoking status: Never    Passive exposure: Yes   Smokeless tobacco: Never  Vaping Use   Vaping Use: Never used  Substance Use Topics   Alcohol use: No    Alcohol/week: 0.0 standard drinks of alcohol   Drug use: No     Allergies   Fish allergy, Sulfa antibiotics, and Amoxicillin   Review of Systems Review of Systems  Per HPI  Physical Exam Triage Vital Signs ED Triage Vitals  Enc Vitals Group     BP 08/24/22 1503 115/62     Pulse Rate 08/24/22 1503 77     Resp 08/24/22 1503 18     Temp 08/24/22 1503  98.4 F (36.9 C)     Temp src --      SpO2 08/24/22 1503 100 %     Weight --      Height --      Head Circumference --      Peak Flow --      Pain Score 08/24/22 1501 10     Pain Loc --      Pain Edu? --      Excl. in GC? --    No data found.  Updated Vital Signs BP 115/62   Pulse 77   Temp 98.4 F (36.9 C)   Resp 18   LMP 07/31/2022   SpO2 100%   Physical Exam Vitals and nursing note reviewed.  Constitutional:      General: She is not in acute distress. Cardiovascular:     Rate and Rhythm: Normal rate and regular rhythm.  Musculoskeletal:        General: Signs of injury present. No swelling or deformity. Normal range of motion.  Skin:    Comments: 1 cm, approximated lac to  the pad of left ring finger.  No swelling or deformity.  No damage to nail  Neurological:     Mental Status: She is alert and oriented to person, place, and time.     Comments: Cap refill less than 2 seconds, sensation intact.  Full range of motion of the finger joints     UC Treatments / Results  Labs (all labs ordered are listed, but only abnormal results are displayed) Labs Reviewed - No data to display  EKG   Radiology DG Finger Ring Left  Result Date: 08/24/2022 CLINICAL DATA:  Trauma EXAM: LEFT RING FINGER 2+V COMPARISON:  None Available. FINDINGS: No fracture or dislocation is seen. There are no opaque foreign bodies. IMPRESSION: No fracture or dislocation is seen in left ring finger. Electronically Signed   By: Ernie Avena M.D.   On: 08/24/2022 15:22    Procedures Procedures (including critical care time)  Medications Ordered in UC Medications - No data to display  Initial Impression / Assessment and Plan / UC Course  I have reviewed the triage vital signs and the nursing notes.  Pertinent labs & imaging results that were available during my care of the patient were reviewed by me and considered in my medical decision making (see chart for details).  Patient requesting x-ray which returns negative for acute abnormality. Cleaned and applied antibiotic ointment to the small laceration.  Wrapped finger with gauze and Coban for protection.  Patient is requesting a finger splint to take home.  We discussed not needed given negative x-ray but provided per request. Discussed wound care, using ibuprofen or Tylenol for pain Return precautions discussed. Patient agrees to plan  Final Clinical Impressions(s) / UC Diagnoses   Final diagnoses:  Injury of ring finger, left, initial encounter  Laceration of left ring finger without foreign body without damage to nail, initial encounter     Discharge Instructions      You can clean area with soap and water Wrap as  needed Apply antibiotic ointment twice daily until skin heals Tylenol or ibuprofen for pain control as needed    ED Prescriptions   None    PDMP not reviewed this encounter.   Christopherjame Carnell, Lurena Joiner, Cordelia Poche 08/24/22 1650

## 2022-08-24 NOTE — Discharge Instructions (Addendum)
You can clean area with soap and water Wrap as needed Apply antibiotic ointment twice daily until skin heals Tylenol or ibuprofen for pain control as needed

## 2022-08-24 NOTE — ED Triage Notes (Signed)
Pt reports she injured her Lt ring finger on the leg press at the gym today. Pt reports bleeding from site at time of injury. Lt reing finger is bruised and swollen and has a skin flap.

## 2022-08-24 NOTE — Telephone Encounter (Signed)
Noted  

## 2022-08-27 ENCOUNTER — Other Ambulatory Visit (HOSPITAL_BASED_OUTPATIENT_CLINIC_OR_DEPARTMENT_OTHER): Payer: Self-pay

## 2022-09-03 ENCOUNTER — Encounter: Payer: Self-pay | Admitting: Family Medicine

## 2022-09-03 DIAGNOSIS — D563 Thalassemia minor: Secondary | ICD-10-CM | POA: Insufficient documentation

## 2022-09-07 DIAGNOSIS — Z419 Encounter for procedure for purposes other than remedying health state, unspecified: Secondary | ICD-10-CM | POA: Diagnosis not present

## 2022-09-19 ENCOUNTER — Ambulatory Visit: Payer: 59 | Attending: Licensed Clinical Social Worker | Admitting: Licensed Clinical Social Worker

## 2022-09-19 DIAGNOSIS — F4323 Adjustment disorder with mixed anxiety and depressed mood: Secondary | ICD-10-CM | POA: Diagnosis not present

## 2022-09-24 ENCOUNTER — Other Ambulatory Visit (HOSPITAL_BASED_OUTPATIENT_CLINIC_OR_DEPARTMENT_OTHER): Payer: Self-pay

## 2022-09-25 ENCOUNTER — Other Ambulatory Visit: Payer: Self-pay | Admitting: Allergy

## 2022-09-25 ENCOUNTER — Other Ambulatory Visit (HOSPITAL_BASED_OUTPATIENT_CLINIC_OR_DEPARTMENT_OTHER): Payer: Self-pay

## 2022-09-25 MED FILL — Fluticasone Propionate Nasal Susp 50 MCG/ACT: NASAL | 30 days supply | Qty: 16 | Fill #0 | Status: AC

## 2022-09-27 ENCOUNTER — Other Ambulatory Visit (HOSPITAL_BASED_OUTPATIENT_CLINIC_OR_DEPARTMENT_OTHER): Payer: Self-pay

## 2022-10-08 DIAGNOSIS — Z419 Encounter for procedure for purposes other than remedying health state, unspecified: Secondary | ICD-10-CM | POA: Diagnosis not present

## 2022-10-24 ENCOUNTER — Ambulatory Visit
Payer: Commercial Managed Care - PPO | Attending: Licensed Clinical Social Worker | Admitting: Licensed Clinical Social Worker

## 2022-10-24 DIAGNOSIS — F4323 Adjustment disorder with mixed anxiety and depressed mood: Secondary | ICD-10-CM | POA: Diagnosis not present

## 2022-11-22 ENCOUNTER — Other Ambulatory Visit (HOSPITAL_BASED_OUTPATIENT_CLINIC_OR_DEPARTMENT_OTHER): Payer: Self-pay

## 2022-11-28 ENCOUNTER — Ambulatory Visit
Payer: Commercial Managed Care - PPO | Attending: Licensed Clinical Social Worker | Admitting: Licensed Clinical Social Worker

## 2022-11-28 DIAGNOSIS — F4323 Adjustment disorder with mixed anxiety and depressed mood: Secondary | ICD-10-CM

## 2022-12-18 ENCOUNTER — Other Ambulatory Visit (HOSPITAL_BASED_OUTPATIENT_CLINIC_OR_DEPARTMENT_OTHER): Payer: Self-pay

## 2022-12-24 NOTE — BH Specialist Note (Signed)
Integrated Behavioral Health Follow Up In-Person Visit  MRN: [id number] Name: Jeanette Hernandez  Number of [facility] Clinician visits: 6-Sixth Visit  Session Start time: [id number]   Session End time: [id number]  Total time in minutes: 55   Types of Service: Individual psychotherapy  Interpretor:No. Interpretor Name and Language: n/a  Subjective: [name] is a 22 y.o. female accompanied by  herself. Patient was referred by PCP for anxiety and sadness. Patient reports the following symptoms/concerns: worrying a lot, sadness and self doubt Duration of problem: about a year ago; Severity of problem: mild   Objective: Mood: Euthymic and Affect: Appropriate and a little flat Risk of harm to self or others: No plan to harm self or others   Life Context: Family and Social: lives with mother School/Work: currently working at nights Self-Care: reading affirmations and working out Life Changes: Patient was bullied a lot in high school this has affected her a lot and she had a panic attack almost a year ago  Patient and/or Family's Strengths/Protective Factors: Scientist, product/process development, Social and Patent attorney, Concrete supports in place (healthy food, safe environments, etc.), Sense of purpose, and Physical Health (exercise, healthy diet, medication compliance, etc.)  Goals Addressed: Patient will:  Reduce symptoms of: anxiety   Increase knowledge and/or ability of: stress reduction   Demonstrate ability to: Increase healthy adjustment to current life circumstances  Progress towards Goals: Ongoing  Interventions: Interventions utilized:  Supportive Counseling and Communication Skills Standardized Assessments completed: GAD-7 and PHQ 9 Social Skills Training: Provide guidance on developing and practicing social skills, assertiveness, and effective communication strategies to reduce social anxiety and improve interpersonal relationships.  Patient and/or Family  Response: Patient seems to be doing well patient discussed how her job is still very stressful.  However it is not causing more anxiety or depression on her.  Patient states that she is able to manage her anxiety and depression pretty well still without medication.  Patient is going to seek out ongoing therapy.  Patient Centered Plan: Patient is on the following Treatment Plan(s): Life Stressors Assessment: Patient currently experiencing reduced symptoms of anxiety and depression.   Patient may benefit from continued support from a therapist at [facility] and be transferred to an outside therapist soon.  Plan: Follow up with behavioral health clinician on : 4weeks Behavioral recommendations: seek out outside therapist to continue and monitor her anxiety and depression. Referral(s): [facility] (In Clinic) and Counselor "From scale of 1-10, how likely are you to follow plan?": 10  Shann Medal, LCSWA

## 2022-12-24 NOTE — BH Specialist Note (Signed)
Integrated Behavioral Health Follow Up In-Person Visit  MRN: [id number] Name: Jeanette Hernandez  Number of [place] Clinician visits: 5- Fourth Visit  Session Start time: [id number]   Session End time: [id number]  Total time in minutes: 72   Types of Service: Individual psychotherapy  Interpretor:No. Interpretor Name and Language: n/a  Subjective: Jeanette Hernandez is a 22 y.o. female accompanied by  herself. Patient was referred by PCP for anxiety and sadness. Patient reports the following symptoms/concerns: worrying a lot, sadness and self doubt Duration of problem: about a year ago; Severity of problem: mild   Objective: Mood: Euthymic and Affect: Appropriate and a little flat Risk of harm to self or others: No plan to harm self or others   Life Context: Family and Social: lives with mother School/Work: currently working at nights Self-Care: reading affirmations and working out Life Changes: Patient was bullied a lot in high school this has affected her a lot and she had a panic attack almost a year ago.  Patient and/or Family's Strengths/Protective Factors: Social connections, Social and Emotional competence, and Concrete supports in place (healthy food, safe environments, etc.) Social and Emotional competence, Concrete supports in place (healthy food, safe environments, etc.), and Sense of purpose Goals Addressed: Patient will:  Reduce symptoms of: anxiety and depression   Increase knowledge and/or ability of: self-management skills   Demonstrate ability to: Increase healthy adjustment to current life circumstances and Increase adequate support systems for patient/family  Progress towards Goals: Ongoing  Interventions: Interventions utilized:  Mindfulness or Relaxation Training and Supportive Counseling Standardized Assessments completed: GAD-7 and PHQ 9  Patient and/or Family Response: Patient talked about how her holiday season. patient talked about an  incident with a guy that she likes and how he randomly stopped talking to her.  Patient discussed some stressful interactions with other coworkers at her job.  Patient was very receptive to what therapist had to say about managing her stress at work.  Patient Centered Plan: Patient is on the following Treatment Plan(s): Anxiety and Depression Assessment: Patient currently experiencing anxious and reduces symptoms of depression.   Patient may benefit from ongoing therapy in the community, having a conversation with her boss about how she is being treated at work.  Plan: Follow up with behavioral health clinician on : 4 weeks Behavioral recommendations: eating healthier, working out, journaling, creating goals for the year. Referral(s): [facility] (In Clinic) "From scale of 1-10, how likely are you to follow plan?": 10  Shann Medal, LCSWA

## 2022-12-24 NOTE — BH Specialist Note (Signed)
Integrated Behavioral Health Follow Up In-Person Visit  MRN: [id number] Name: Jeanette Hernandez  Number of [place] Clinician visits: 4- Fourth Visit  Session Start time: [id number]   Session End time: [id number]  Total time in minutes: 62   Types of Service: Individual psychotherapy  Interpretor:No. Interpretor Name and Language: n/a  Subjective: Jeanette Hernandez is a 22 y.o. female accompanied by  herself. Patient was referred by PCP for anxiety and sadness. Patient reports the following symptoms/concerns: worrying a lot, sadness and self doubt Duration of problem: about a year ago; Severity of problem: mild   Objective: Mood: Euthymic and Affect: Appropriate and a little flat Risk of harm to self or others: No plan to harm self or others   Life Context: Family and Social: lives with mother School/Work: currently working at nights Self-Care: reading affirmations and working out Life Changes: Patient was bullied a lot in high school this has affected her a lot and she had a panic attack almost a year ago.  Patient and/or Family's Strengths/Protective Factors: Social and Emotional competence and Concrete supports in place (healthy food, safe environments, etc.)  Goals Addressed: Patient will:  Reduce symptoms of: anxiety   Increase knowledge and/or ability of: coping skills   Demonstrate ability to: Increase healthy adjustment to current life circumstances and Increase adequate support systems for patient/family  Progress towards Goals: Ongoing  Interventions: Interventions utilized:  Mindfulness or Relaxation Training and CBT Cognitive Behavioral Therapy Standardized Assessments completed: Not Needed  Patient and/or Family Response: Jeanette Hernandez discussed her family situation.  Jeanette Hernandez discussed what she has planned for the holiday season.  Patient discussed her previous years of dealing with sadness and anxiety.  Patient also discussed what books that she is reading to  affect her mood in a positive way.  Patient Centered Plan: Patient is on the following Treatment Plan(s): anxiety and depression Assessment: Patient currently experiencing feeling very little energy, not sleeping well, still often irritated. .   Patient may benefit from [facility] LCSWA to maintain coping skills and create more if needed. .  Plan: Follow up with behavioral health clinician on : 4 weeks Behavioral recommendations: continued journaling, affirmations, exercising  Referral(s): [place] (In Clinic) "From scale of 1-10, how likely are you to follow plan?": 10  Jeanette Hernandez, LCSWA

## 2022-12-26 ENCOUNTER — Ambulatory Visit
Payer: Commercial Managed Care - PPO | Attending: Licensed Clinical Social Worker | Admitting: Licensed Clinical Social Worker

## 2022-12-26 DIAGNOSIS — F4323 Adjustment disorder with mixed anxiety and depressed mood: Secondary | ICD-10-CM | POA: Diagnosis not present

## 2022-12-26 NOTE — BH Specialist Note (Signed)
Integrated Behavioral Health Follow Up In-Person Visit  MRN: [id number] Name: Jeanette Hernandez  Number of [place] Clinician visits: Additional Visit  Session Start time: 1122   Session End time: [id number]  Total time in minutes: 43   Types of Service: Individual psychotherapy  Interpretor:No. Interpretor Name and Language: n/a  Subjective: Jeanette Hernandez is a 22 y.o. female accompanied by  herself. Patient was referred by PCP for anxiety and sadness. Patient reports the following symptoms/concerns: worrying a lot, sadness and self doubt Duration of problem: about a year ago; Severity of problem: mild  Objective: Mood: Euthymic and Affect: Appropriate Risk of harm to self or others: No plan to harm self or others  Life Context: Family and Social: lives with mother School/Work: currently working at nights Self-Care: reading affirmations and working out Life Changes: Patient was bullied a lot in high school this has affected her a lot and she had a panic attack almost a year ago  Patient and/or Family's Strengths/Protective Factors: Concrete supports in place (healthy food, safe environments, etc.), Sense of purpose, and Physical Health (exercise, healthy diet, medication compliance, etc.)  Goals Addressed: Patient will:  Reduce symptoms of: anxiety   Increase knowledge and/or ability of: stress reduction   Demonstrate ability to: Increase healthy adjustment to current life circumstances  Progress towards Goals: Achieved  Interventions: Interventions utilized:  Supportive Reflection Standardized Assessments completed: GAD-7 and PHQ 9  Patient and/or Family Response: Patient shared how to changing her diet, exercising, and having a routine has been really been impactful on her mental health and overall health.  Patient talked about her plans for the future and her goals for education.  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  Patient  was receptive and happy to accomplish her goals and is looking forward to seeking an outside counselor for maintenance.  Patient Centered Plan: Patient is on the following Treatment Plan(s): Life Stressors Assessment: Patient currently experiencing significantly reduced levels of stress and anxiety.   Patient may benefit from some therapy and maintenance with an outside therapist.  Plan: Follow up with behavioral health clinician on : services are complete Behavioral recommendations: therapy once a month for maintenance. Referral(s): Counselor "From scale of 1-10, how likely are you to follow plan?": 10  Shann Medal, LCSWA

## 2022-12-28 ENCOUNTER — Other Ambulatory Visit (HOSPITAL_BASED_OUTPATIENT_CLINIC_OR_DEPARTMENT_OTHER): Payer: Self-pay

## 2023-01-07 ENCOUNTER — Other Ambulatory Visit (HOSPITAL_BASED_OUTPATIENT_CLINIC_OR_DEPARTMENT_OTHER): Payer: Self-pay

## 2023-01-10 ENCOUNTER — Other Ambulatory Visit (HOSPITAL_BASED_OUTPATIENT_CLINIC_OR_DEPARTMENT_OTHER): Payer: Self-pay

## 2023-02-12 ENCOUNTER — Other Ambulatory Visit: Payer: Self-pay

## 2023-02-12 ENCOUNTER — Other Ambulatory Visit: Payer: Self-pay | Admitting: Physician Assistant

## 2023-02-12 ENCOUNTER — Other Ambulatory Visit (HOSPITAL_BASED_OUTPATIENT_CLINIC_OR_DEPARTMENT_OTHER): Payer: Self-pay

## 2023-02-12 DIAGNOSIS — Z3041 Encounter for surveillance of contraceptive pills: Secondary | ICD-10-CM

## 2023-02-12 MED ORDER — NORETHIN ACE-ETH ESTRAD-FE 1.5-30 MG-MCG PO TABS
1.0000 | ORAL_TABLET | Freq: Every day | ORAL | 0 refills | Status: DC
  Filled 2023-02-12: qty 84, 84d supply, fill #0

## 2023-02-12 NOTE — Telephone Encounter (Signed)
Requested Prescriptions  Pending Prescriptions Disp Refills   norethindrone-ethinyl estradiol-iron (BLISOVI FE 1.5/30) 1.5-30 MG-MCG tablet 84 tablet 0    Sig: TAKE 1 TABLET BY MOUTH DAILY.     OB/GYN:  Contraceptives Passed - 02/12/2023  2:52 PM      Passed - Last BP in normal range    BP Readings from Last 1 Encounters:  08/24/22 115/62         Passed - Valid encounter within last 12 months    Recent Outpatient Visits           6 months ago Fatigue, unspecified type   [facility] [place], [place], [place]   8 months ago Anxiety and depression   [facility] [place], Odette Horns, MD   11 months ago Family planning, BCP (birth control pills) maintenance   [facility] [place], [place], [place]   2 years ago Surveillance for birth control, oral contraceptives   [facility] [place], Odette Horns, MD   2 years ago Abnormal uterine bleeding   [facility] Hoy Register, MD       Future Appointments             In 1 month Hoy Register, MD [facility]            Passed - Patient is not a smoker

## 2023-02-13 ENCOUNTER — Ambulatory Visit (INDEPENDENT_AMBULATORY_CARE_PROVIDER_SITE_OTHER): Payer: Commercial Managed Care - PPO | Admitting: Allergy

## 2023-02-13 ENCOUNTER — Encounter: Payer: Self-pay | Admitting: Allergy

## 2023-02-13 ENCOUNTER — Other Ambulatory Visit (HOSPITAL_BASED_OUTPATIENT_CLINIC_OR_DEPARTMENT_OTHER): Payer: Self-pay

## 2023-02-13 VITALS — BP 124/86 | HR 85 | Temp 97.9°F | Resp 20

## 2023-02-13 DIAGNOSIS — H1013 Acute atopic conjunctivitis, bilateral: Secondary | ICD-10-CM | POA: Diagnosis not present

## 2023-02-13 DIAGNOSIS — J4520 Mild intermittent asthma, uncomplicated: Secondary | ICD-10-CM

## 2023-02-13 DIAGNOSIS — T7800XD Anaphylactic reaction due to unspecified food, subsequent encounter: Secondary | ICD-10-CM

## 2023-02-13 DIAGNOSIS — J3089 Other allergic rhinitis: Secondary | ICD-10-CM | POA: Diagnosis not present

## 2023-02-13 MED ORDER — EPINEPHRINE 0.3 MG/0.3ML IJ SOAJ
0.3000 mg | INTRAMUSCULAR | 2 refills | Status: DC | PRN
  Filled 2023-02-13: qty 2, 30d supply, fill #0

## 2023-02-13 MED ORDER — MONTELUKAST SODIUM 10 MG PO TABS
10.0000 mg | ORAL_TABLET | Freq: Every day | ORAL | 5 refills | Status: DC
  Filled 2023-02-13 – 2023-03-25 (×2): qty 30, 30d supply, fill #0
  Filled 2023-05-02: qty 30, 30d supply, fill #1
  Filled 2023-06-20: qty 30, 30d supply, fill #2
  Filled 2023-07-23: qty 30, 30d supply, fill #3

## 2023-02-13 MED ORDER — ALBUTEROL SULFATE HFA 108 (90 BASE) MCG/ACT IN AERS
2.0000 | INHALATION_SPRAY | Freq: Four times a day (QID) | RESPIRATORY_TRACT | 1 refills | Status: DC | PRN
  Filled 2023-02-13: qty 6.7, 25d supply, fill #0

## 2023-02-13 NOTE — Progress Notes (Signed)
Follow-up Note  RE: Jeanette Hernandez MRN: [id number] DOB: [birth year] Date of Office Visit: 02/13/2023   History of present illness: Jeanette Hernandez is a 22 y.o. female presenting today for follow-up of allergic rhinitis, asthma and food allergy.  She was last seen in the office on 08/10/22 by myself.   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  She states she was fatigued often and this seems to be the cause.   She states her allergies have been acting up especially if she cuts the grass.  She walks outside multiple times a week.  She notes runny nose and sneezing with activities like cutting the grass.  She takes Freight forwarder daily and both are still effective.  The ryaltris she states would cause the nose the crust and was white in color.  It did help however with the symptoms.  She is using flonase as needed now.  She is using a lubricant eye drop at the time but states she likely she be using an allergy based eye drop.    She states she had a URI over the winter and recalls using albuterol at that time but that was only time. She did not require antibiotic or steroids for her URI.  This is the only albuterol use she can recall since last visit.  She continues to avoid to fish. She has not had any accidental ingestions or need to use her epinephrine device.   Review of systems: Review of Systems  Constitutional: Negative.   HENT:         See HPI  Eyes: Negative.   Respiratory: Negative.    Cardiovascular: Negative.   Gastrointestinal: Negative.   Musculoskeletal: Negative.   Skin: Negative.   Allergic/Immunologic: Negative.   Neurological: Negative.      All other systems negative unless noted above in HPI  Past medical/social/surgical/family history have been reviewed and are unchanged unless specifically indicated below.  No changes  Medication List: Current Outpatient Medications  Medication Sig Dispense Refill   albuterol (PROVENTIL) (2.5  MG/3ML) 0.083% nebulizer solution Inhale 3 mLs (2.5 mg total) by nebulization every 4 (four) hours as needed for wheezing or shortness of breath. 75 mL 1   [facility] ALLERGY 25 MG capsule   0   fexofenadine (ALLEGRA) 180 MG tablet Take 1 tablet (180 mg total) by mouth daily. 30 tablet 5   fluticasone (FLONASE) 50 MCG/ACT nasal spray Place 1 spray into both nostrils 2 (two) times daily as needed. 16 g 5   ibuprofen (ADVIL,MOTRIN) 800 MG tablet Take 1 tablet (800 mg total) by mouth 3 (three) times daily. 21 tablet 0   Multiple Vitamins-Minerals (MULTIVITAMIN PO) Take by mouth.     norethindrone-ethinyl estradiol-iron (BLISOVI FE 1.5/30) 1.5-30 MG-MCG tablet Take 1 tablet by mouth daily. 84 tablet 0   albuterol (VENTOLIN HFA) 108 (90 Base) MCG/ACT inhaler Inhale 2 puffs into the lungs every 6 (six) hours as needed for wheezing or shortness of breath. 18 g 1   EPINEPHrine (EPIPEN 2-PAK) 0.3 mg/0.3 mL IJ SOAJ injection Inject 0.3 mg into the muscle as needed for anaphylaxis. 2 each 2   montelukast (SINGULAIR) 10 MG tablet Take 1 tablet (10 mg total) by mouth at bedtime. 30 tablet 5   No current facility-administered medications for this visit.     Known medication allergies: Allergies  Allergen Reactions   Fish Allergy Anaphylaxis    Such as Talapia   Not shellfish  Sulfa Antibiotics Anaphylaxis   Amoxicillin      Physical examination: Blood pressure 124/86, pulse 85, temperature 97.9 F (36.6 C), temperature source Temporal, resp. rate 20, SpO2 99 %.  General: Alert, interactive, in no acute distress. HEENT: PERRLA, TMs pearly gray, turbinates non-edematous without discharge, post-pharynx non erythematous. Neck: Supple without lymphadenopathy. Lungs: Clear to auscultation without wheezing, rhonchi or rales. {no increased work of breathing. CV: Normal S1, S2 without murmurs. Abdomen: Nondistended, nontender. Skin: Warm and dry, without lesions or rashes. Extremities:  No clubbing, cyanosis  or edema. Neuro:   Grossly intact.  Diagnositics/Labs:  Spirometry: FEV1: 2.71L 95%, FVC: 3.27L 100%, ratio consistent with nonobstructive pattern  Assessment and plan: Allergic rhinitis Continue avoidance measures for mites and cats.   Continue Allegra (fexofenadine) daily at this time Continue Singulair (montelukast) 10mg  daily at night. May use Flonase (fluticasone) nasal spray 1 spray per nostril twice a day as needed for nasal congestion.  If having persistent runny nose you can use Astepro (this is OTC) nasal spray 2 sprays each nostril twice a day as needed For itchy/watery eyes can use as needed allergy based eyedrop (some OTC options would be Pataday, Alaway, Zaditor or Lastacaft)  Asthma Daily controller medication(s): Singulair (montelukast) 10mg  daily at night. May use albuterol rescue inhaler 2 puffs or nebulizer every 4 to 6 hours as needed for shortness of breath, chest tightness, coughing, and wheezing. May use albuterol rescue inhaler 2 puffs 5 to 15 minutes prior to strenuous physical activities. Monitor frequency of use.  Asthma control goals:  Full participation in all desired activities (may need albuterol before activity) Albuterol use two times or less a week on average (not counting use with activity) Cough interfering with sleep two times or less a month Oral steroids no more than once a year No hospitalizations  Food allergy Continue strict avoidance of fish. For mild symptoms you can take over the counter antihistamines such as Benadryl and monitor symptoms closely. If symptoms worsen or if you have severe symptoms including breathing issues, throat closure, significant swelling, whole body hives, severe diarrhea and vomiting, lightheadedness then inject epinephrine and seek immediate medical care afterwards. Action plan in place.   Follow up in 6-12 months or sooner if needed.   I appreciate the opportunity to take part in [place] care. Please do not  hesitate to contact me with questions.  Sincerely,   Margo Aye, MD Allergy/Immunology [facility]

## 2023-02-13 NOTE — Patient Instructions (Addendum)
Allergic rhinitis Continue avoidance measures for mites and cats.   Continue Allegra (fexofenadine) daily at this time Continue Singulair (montelukast) 10mg  daily at night. May use Flonase (fluticasone) nasal spray 1 spray per nostril twice a day as needed for nasal congestion.  If having persistent runny nose you can use Astepro (this is OTC) nasal spray 2 sprays each nostril twice a day as needed For itchy/watery eyes can use as needed allergy based eyedrop (some OTC options would be Pataday, Alaway, Zaditor or Lastacaft)  Asthma Daily controller medication(s): Singulair (montelukast) 10mg  daily at night. May use albuterol rescue inhaler 2 puffs or nebulizer every 4 to 6 hours as needed for shortness of breath, chest tightness, coughing, and wheezing. May use albuterol rescue inhaler 2 puffs 5 to 15 minutes prior to strenuous physical activities. Monitor frequency of use.  Asthma control goals:  Full participation in all desired activities (may need albuterol before activity) Albuterol use two times or less a week on average (not counting use with activity) Cough interfering with sleep two times or less a month Oral steroids no more than once a year No hospitalizations  Food allergy Continue strict avoidance of fish. For mild symptoms you can take over the counter antihistamines such as Benadryl and monitor symptoms closely. If symptoms worsen or if you have severe symptoms including breathing issues, throat closure, significant swelling, whole body hives, severe diarrhea and vomiting, lightheadedness then inject epinephrine and seek immediate medical care afterwards. Action plan in place.   Follow up in 6-12 months or sooner if needed.

## 2023-03-14 ENCOUNTER — Ambulatory Visit: Payer: 59 | Admitting: Family Medicine

## 2023-03-25 ENCOUNTER — Other Ambulatory Visit (HOSPITAL_BASED_OUTPATIENT_CLINIC_OR_DEPARTMENT_OTHER): Payer: Self-pay

## 2023-05-02 ENCOUNTER — Other Ambulatory Visit (HOSPITAL_BASED_OUTPATIENT_CLINIC_OR_DEPARTMENT_OTHER): Payer: Self-pay

## 2023-05-02 ENCOUNTER — Other Ambulatory Visit: Payer: Self-pay

## 2023-05-02 ENCOUNTER — Other Ambulatory Visit: Payer: Self-pay | Admitting: Family Medicine

## 2023-05-02 DIAGNOSIS — Z3041 Encounter for surveillance of contraceptive pills: Secondary | ICD-10-CM

## 2023-05-02 MED ORDER — NORETHIN ACE-ETH ESTRAD-FE 1.5-30 MG-MCG PO TABS
1.0000 | ORAL_TABLET | Freq: Every day | ORAL | 0 refills | Status: DC
  Filled 2023-05-02: qty 84, 84d supply, fill #0

## 2023-05-02 NOTE — Telephone Encounter (Signed)
Requested Prescriptions  Pending Prescriptions Disp Refills   norethindrone-ethinyl estradiol-iron (BLISOVI FE 1.5/30) 1.5-30 MG-MCG tablet 84 tablet 0    Sig: Take 1 tablet by mouth daily.     OB/GYN:  Contraceptives Passed - 05/02/2023 11:03 AM      Passed - Last BP in normal range    BP Readings from Last 1 Encounters:  02/13/23 124/86         Passed - Valid encounter within last 12 months    Recent Outpatient Visits           8 months ago Fatigue, unspecified type   [facility] [place], [place], [place]   11 months ago Anxiety and depression   [facility] [place], Odette Horns, MD   1 year ago Family planning, BCP (birth control pills) maintenance   [facility] [place], [place], [place]   2 years ago Surveillance for birth control, oral contraceptives   [facility] [place], Odette Horns, MD   2 years ago Abnormal uterine bleeding    [facility] Hoy Register, MD       Future Appointments             In 1 month Hoy Register, MD [facility]            Passed - Patient is not a smoker

## 2023-05-20 ENCOUNTER — Telehealth: Payer: Self-pay | Admitting: Licensed Clinical Social Worker

## 2023-05-20 NOTE — Telephone Encounter (Signed)
Called pt to follow up on how she is doing and to see if she needed to schedule an appointment.

## 2023-05-21 ENCOUNTER — Ambulatory Visit: Payer: Self-pay

## 2023-05-21 ENCOUNTER — Encounter: Payer: Self-pay | Admitting: Internal Medicine

## 2023-05-21 ENCOUNTER — Ambulatory Visit: Payer: Commercial Managed Care - PPO | Attending: Internal Medicine | Admitting: Internal Medicine

## 2023-05-21 VITALS — BP 113/70 | HR 82 | Ht 63.0 in | Wt 320.0 lb

## 2023-05-21 DIAGNOSIS — S8012XA Contusion of left lower leg, initial encounter: Secondary | ICD-10-CM | POA: Diagnosis not present

## 2023-05-21 DIAGNOSIS — M79672 Pain in left foot: Secondary | ICD-10-CM | POA: Diagnosis not present

## 2023-05-21 DIAGNOSIS — M79641 Pain in right hand: Secondary | ICD-10-CM | POA: Diagnosis not present

## 2023-05-21 NOTE — Progress Notes (Signed)
Patient ID: SWATHI SOCKS, female    DOB: 15-Dec-2000  MRN: [id number]  CC: Fall (Fall & hit L side of body (knees, wrists) - experiencing pain. Pt fell this am)   Subjective: Jeanette Hernandez is a 21 y.o. female who presents for UC visit Her concerns today include:  Pt with history of anx/dep, asthma, menorrhagia here for an office visit.   Pt presents today c/o pain in RT hand and LT leg and LT foot post fall this a.m. she tripped when coming down the steps outside of her home this morning.  She tried to break her fall with outstretched right hand.  She landed on her left knee.  Felt really sore but did not feel that anything was broken so she proceeded to go to work at [employer].  To half a mile walk from the parking lot to the building.  As she was walking in she felt soreness in her right leg mainly the thigh and calf and some pain on the bottom of the left foot.  She clocked in but subsequently decided to leave because she felt too sore to work today.  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 75 mL 1   albuterol (VENTOLIN HFA) 108 (90 Base) MCG/ACT inhaler Inhale 2 puffs into the lungs every 6 (six) hours as needed for wheezing or shortness of breath. 6.7 g 1   [facility] ALLERGY 25 MG capsule   0   EPINEPHrine (EPIPEN 2-PAK) 0.3 mg/0.3 mL IJ SOAJ injection Inject 0.3 mg into the muscle as needed for anaphylaxis. 2 each 2    fexofenadine (ALLEGRA) 180 MG tablet Take 1 tablet (180 mg total) by mouth daily. 30 tablet 5   fluticasone (FLONASE) 50 MCG/ACT nasal spray Place 1 spray into both nostrils 2 (two) times daily as needed. 16 g 5   ibuprofen (ADVIL,MOTRIN) 800 MG tablet Take 1 tablet (800 mg total) by mouth 3 (three) times daily. 21 tablet 0   montelukast (SINGULAIR) 10 MG tablet Take 1 tablet (10 mg total) by mouth at bedtime. 30 tablet 5   Multiple Vitamins-Minerals (MULTIVITAMIN PO) Take by mouth.     norethindrone-ethinyl estradiol-iron (BLISOVI FE 1.5/30) 1.5-30 MG-MCG tablet Take 1 tablet by mouth daily. 84 tablet 0   No current facility-administered medications on file prior to visit.    Allergies  Allergen Reactions   Fish Allergy Anaphylaxis    Such as Talapia   Not shellfish   Sulfa Antibiotics Anaphylaxis   Amoxicillin     Social History   Socioeconomic History   Marital status: Single    Spouse name: Not on file   Number of children: Not on file   Years of education: Not on file   Highest education level: Not on file  Occupational History   Not on file  Tobacco Use   Smoking status: Never    Passive exposure: Yes   Smokeless tobacco: Never  Vaping  Use   Vaping status: Never Used  Substance and Sexual Activity   Alcohol use: No    Alcohol/week: 0.0 standard drinks of alcohol   Drug use: No   Sexual activity: Never    Birth control/protection: Abstinence  Other Topics Concern   Not on file  Social History Narrative   Not on file   Social Determinants of Health   Financial Resource Strain: Not on file  Food Insecurity: Not on file  Transportation Needs: Not on file  Physical Activity: Not on file  Stress: Not on file  Social Connections: Not on file  Intimate Partner Violence: Not on file    Family History  Problem Relation Age of Onset   Asthma Father     No past surgical history on file.  ROS: Review of Systems Negative except as stated above  PHYSICAL  EXAM: BP 113/70   Pulse 82   Ht 5\' 3"  (1.6 m)   Wt (!) 320 lb (145.2 kg)   SpO2 99%   BMI 56.69 kg/m   Physical Exam  General appearance - alert, well appearing, young obese African-American female and in no distress Mental status - normal mood, behavior, speech, dress, motor activity, and thought processes Musculoskeletal -right hand: No edema or erythema noted.  She has good range of motion of the right wrist.  Mild discomfort with extension of the right thumb.  Slight tenderness over the thenar eminence. Left leg: No edema appreciated.  No tenderness on palpation over the right knee.  She has good range of motion of the right knee.  No abrasions noted on the knee. Left ankle: No edema or ecchymosis noted.  She has good range of motion of the left ankle.  Mild tenderness on palpation of the mid sole.      Latest Ref Rng & Units 05/21/2022    4:30 PM 07/13/2020    2:43 PM 09/18/2017    8:46 PM  CMP  Glucose 70 - 99 mg/dL 83  66  782   BUN 6 - 20 mg/dL 8  10  8    Creatinine 0.57 - 1.00 mg/dL 9.56  2.13  0.86   Sodium 134 - 144 mmol/L 145  138  135   Potassium 3.5 - 5.2 mmol/L 4.4  3.9  3.2   Chloride 96 - 106 mmol/L 109  99  104   CO2 20 - 29 mmol/L 20  23  25    Calcium 8.7 - 10.2 mg/dL 9.5  8.8  8.6   Total Protein 6.0 - 8.5 g/dL 7.0     Total Bilirubin 0.0 - 1.2 mg/dL <5.7     Alkaline Phos 44 - 121 IU/L 73     AST 0 - 40 IU/L 14     ALT 0 - 32 IU/L 18      Lipid Panel  No results found for: "CHOL", "TRIG", "HDL", "CHOLHDL", "VLDL", "LDLCALC", "LDLDIRECT"  CBC    Component Value Date/Time   WBC 8.9 08/09/2022 1430   WBC 10.9 (H) 09/18/2017 2046   RBC 5.10 08/09/2022 1430   RBC 4.56 09/18/2017 2046   HGB 11.6 08/09/2022 1430   HCT 38.1 08/09/2022 1430   PLT 424 08/09/2022 1430   MCV 75 (L) 08/09/2022 1430   MCH 22.7 (L) 08/09/2022 1430   MCH 23.0 (L) 09/18/2017 2046   MCHC 30.4 (L) 08/09/2022 1430   MCHC 31.5 09/18/2017 2046   RDW 14.5 08/09/2022 1430    LYMPHSABS 2.2 08/09/2022 1430  EOSABS 0.1 08/09/2022 1430   BASOSABS 0.0 08/09/2022 1430    ASSESSMENT AND PLAN:  1. Contusion of left lower extremity, initial encounter 2. Acute foot pain, left 3. Right hand pain -Exam does not suggest any acute fractures. I recommend icing of the right hand, left foot and left leg over the next 24 hours then after that she can use heat.  Recommend taking Tylenol or ibuprofen over-the-counter as needed.  She states that ibuprofen causes nausea for her.  She is off work Advertising account executive.  We agreed for a release to return to work on Thursday.  Letter given.    Patient was given the opportunity to ask questions.  Patient verbalized understanding of the plan and was able to repeat key elements of the plan.   This documentation was completed using Paediatric nurse.  Any transcriptional errors are unintentional.  No orders of the defined types were placed in this encounter.    Requested Prescriptions    No prescriptions requested or ordered in this encounter    No follow-ups on file.  Jonah Blue, MD, FACP

## 2023-05-21 NOTE — Telephone Encounter (Signed)
      Chief Complaint: Larey Seat coming out of her home, fell on steps. Hurt both arms and left ankle. Has abrasions. Symptoms: Pain 8/10 Frequency: This morning Pertinent Negatives: Patient denies lacerations or head injury. Disposition: [] ED /[] Urgent Care (no appt availability in office) / [x] Appointment(In office/virtual)/ []  [place]/ [] Home Care/ [] Refused Recommended Disposition /[] [facility]/ []  Follow-up with PCP Additional Notes: Pt. Agrees with appointment today.  Reason for Disposition  MILD weakness (i.e., does not interfere with ability to work, go to school, normal activities)  (Exception: Mild weakness is a chronic symptom.)  Answer Assessment - Initial Assessment Questions 1. MECHANISM: "How did the fall happen?"     Fell on steps outside 2. DOMESTIC VIOLENCE AND ELDER ABUSE SCREENING: "Did you fall because someone pushed you or tried to hurt you?" If Yes, ask: "Are you safe now?"     No 3. ONSET: "When did the fall happen?" (e.g., minutes, hours, or days ago)     Today 4. LOCATION: "What part of the body hit the ground?" (e.g., back, buttocks, head, hips, knees, hands, head, stomach)     Left side 5. INJURY: "Did you hurt (injure) yourself when you fell?" If Yes, ask: "What did you injure? Tell me more about this?" (e.g., body area; type of injury; pain severity)"     Yes - both arms, left leg. Scrape to leg and arm 6. PAIN: "Is there any pain?" If Yes, ask: "How bad is the pain?" (e.g., Scale 1-10; or mild,  moderate, severe)   - NONE (0): No pain   - MILD (1-3): Doesn't interfere with normal activities    - MODERATE (4-7): Interferes with normal activities or awakens from sleep    - SEVERE (8-10): Excruciating pain, unable to do any normal activities      8 7. SIZE: For cuts, bruises, or swelling, ask: "How large is it?" (e.g., inches or centimeters)      Abrasions 8. PREGNANCY: "Is there any chance you are pregnant?" "When was your last  menstrual period?"     No 9. OTHER SYMPTOMS: "Do you have any other symptoms?" (e.g., dizziness, fever, weakness; new onset or worsening).      No 10. CAUSE: "What do you think caused the fall (or falling)?" (e.g., tripped, dizzy spell)       Tripped  Protocols used: Falls and [facility]

## 2023-05-30 ENCOUNTER — Telehealth: Payer: Self-pay | Admitting: *Deleted

## 2023-05-30 NOTE — Telephone Encounter (Signed)
Patient calling to request when last tetanus shot given. In review of chart under health maintenance, DTaP/Tdap/Td never done , no completion history exists for this topic. Patient reports she needs shot for work and needs today. Recommended patient go to UC. Patient reports reports she asked for tetanus shot last OV 05/21/23 but no documentation noted . she is at office but time 4:59 pm. Reviewed she needs to go to UC  or /ED.

## 2023-05-31 NOTE — Telephone Encounter (Signed)
I do not recall. Called & LVM to call back to schedule a nurse visit for a Tdap vaccination.

## 2023-05-31 NOTE — Telephone Encounter (Addendum)
Do not see this vaccine in record.  Attempt to call patient regarding vaccine. No answer.  Forwarded call to Dr. Henriette Combs CMA.

## 2023-06-03 NOTE — Telephone Encounter (Signed)
Left message on voicemail to return call.  Has an appt 06/05/2023 at 1550.   Can have vaccine updated at that apt.

## 2023-06-04 ENCOUNTER — Encounter: Payer: Self-pay | Admitting: Family Medicine

## 2023-06-05 ENCOUNTER — Ambulatory Visit: Payer: Self-pay | Admitting: Family Medicine

## 2023-06-19 ENCOUNTER — Encounter: Payer: Self-pay | Admitting: Family Medicine

## 2023-06-19 ENCOUNTER — Other Ambulatory Visit (HOSPITAL_BASED_OUTPATIENT_CLINIC_OR_DEPARTMENT_OTHER): Payer: Self-pay

## 2023-06-19 ENCOUNTER — Ambulatory Visit: Payer: Commercial Managed Care - PPO | Attending: Family Medicine | Admitting: Family Medicine

## 2023-06-19 ENCOUNTER — Ambulatory Visit
Payer: Commercial Managed Care - PPO | Attending: Licensed Clinical Social Worker | Admitting: Licensed Clinical Social Worker

## 2023-06-19 ENCOUNTER — Other Ambulatory Visit (HOSPITAL_COMMUNITY): Payer: Self-pay

## 2023-06-19 VITALS — BP 122/76 | HR 87 | Ht 63.0 in | Wt 324.0 lb

## 2023-06-19 DIAGNOSIS — Z3041 Encounter for surveillance of contraceptive pills: Secondary | ICD-10-CM | POA: Diagnosis not present

## 2023-06-19 DIAGNOSIS — Z131 Encounter for screening for diabetes mellitus: Secondary | ICD-10-CM

## 2023-06-19 DIAGNOSIS — J4520 Mild intermittent asthma, uncomplicated: Secondary | ICD-10-CM | POA: Diagnosis not present

## 2023-06-19 DIAGNOSIS — F4323 Adjustment disorder with mixed anxiety and depressed mood: Secondary | ICD-10-CM

## 2023-06-19 LAB — POCT GLYCOSYLATED HEMOGLOBIN (HGB A1C): Hemoglobin A1C: 5.5 % (ref 4.0–5.6)

## 2023-06-19 MED ORDER — NALTREXONE-BUPROPION HCL ER 8-90 MG PO TB12
ORAL_TABLET | ORAL | 2 refills | Status: DC
  Filled 2023-06-19 – 2023-06-26 (×3): qty 120, 30d supply, fill #0
  Filled 2023-06-27: qty 120, 38d supply, fill #0

## 2023-06-19 MED ORDER — ALBUTEROL SULFATE HFA 108 (90 BASE) MCG/ACT IN AERS
2.0000 | INHALATION_SPRAY | Freq: Four times a day (QID) | RESPIRATORY_TRACT | 1 refills | Status: DC | PRN
  Filled 2023-06-19: qty 6.7, 25d supply, fill #0

## 2023-06-19 MED ORDER — NORETHIN ACE-ETH ESTRAD-FE 1.5-30 MG-MCG PO TABS
1.0000 | ORAL_TABLET | Freq: Every day | ORAL | 3 refills | Status: DC
  Filled 2023-06-19 – 2023-08-05 (×2): qty 84, 84d supply, fill #0
  Filled 2023-10-23: qty 84, 84d supply, fill #1
  Filled 2024-01-16: qty 84, 84d supply, fill #2
  Filled 2024-04-09: qty 28, 28d supply, fill #3
  Filled 2024-05-08: qty 28, 28d supply, fill #4
  Filled 2024-06-05: qty 28, 28d supply, fill #5

## 2023-06-19 NOTE — Patient Instructions (Signed)
Naltrexone; Bupropion Extended-Release Tablets What is this medication? NALTREXONE; BUPROPION (nal TREX one; byoo PROE pee on) promotes weight loss. It may also be used to maintain weight loss. It works by decreasing appetite. Changes to diet and exercise are often combined with this medication. This medicine may be used for other purposes; ask your health care provider or pharmacist if you have questions. COMMON BRAND NAME(S): Contrave What should I tell my care team before I take this medication? They need to know if you have any of these conditions: An eating disorder, such as anorexia or bulimia Diabetes Depression Frequently drink alcohol Glaucoma Head injury Heart disease High blood pressure History of a tumor or infection of your brain or spine History of heart attack or stroke History of irregular heartbeat History of substance use disorder or alcohol use disorder Kidney disease Liver disease Low levels of sodium in the blood Mental health condition Seizures Suicidal thoughts, plans, or attempt by you or a family member Taken an MAOI like Carbex, Eldepryl, Marplan, Nardil, or Parnate in last 14 days An unusual or allergic reaction to bupropion, naltrexone, other medications, foods, dyes, or preservatives Breast-feeding Pregnant or trying to become pregnant How should I use this medication? Take this medication by mouth with a full glass of water. Take it as directed on the prescription label at the same time every day. Do not cut, crush, or chew this medication. Swallow the tablets whole. You can take it with or without food. Do not take with high-fat meals as this may increase your risk of seizures. Keep taking it unless your care team tells you to stop. A special MedGuide will be given to you by the pharmacist with each prescription and refill. Be sure to read this information carefully each time. Talk to your care team about the use of this medication in children. Special  care may be needed. Overdosage: If you think you have taken too much of this medicine contact a poison control center or emergency room at once. NOTE: This medicine is only for you. Do not share this medicine with others. What if I miss a dose? If you miss a dose, skip it. Take your next dose at the normal time. Do not take extra or 2 doses at the same time to make up for the missed dose. What may interact with this medication? Do not take this medication with any of the following: Any medications used to stop taking opioids, such as methadone or buprenorphine Linezolid MAOIs, such as Carbex, Eldepryl, Marplan, Nardil, and Parnate Methylene blue (injected into a vein) Opioid medications Other medications that contain bupropion, such as Zyban or Wellbutrin This medication may also interact with the following: Alcohol Certain medications for blood pressure, such as metoprolol, propranolol Certain medications for depression, anxiety, or mental health conditions Certain medications for HIV or hepatitis Certain medications for irregular heart beat, such as propafenone, flecainide Certain medications for Parkinson disease, such as amantadine, levodopa Certain medications for seizures, such as carbamazepine, phenytoin, phenobarbital Certain medications for sleep Cimetidine Clopidogrel Cyclophosphamide Digoxin Disulfiram Furazolidone Isoniazid Nicotine Orphenadrine Procarbazine Steroid medications, such as prednisone or cortisone Stimulant medications for attention disorders, weight loss, or to stay awake Tamoxifen Theophylline Thiotepa Ticlopidine Tramadol Warfarin This list may not describe all possible interactions. Give your health care provider a list of all the medicines, herbs, non-prescription drugs, or dietary supplements you use. Also tell them if you smoke, drink alcohol, or use illegal drugs. Some items may interact with your medicine. What   should I watch for while using  this medication? Visit your care team for regular checks on your progress. This medication may cause serious skin reactions. They can happen weeks to months after starting the medication. Contact your care team right away if you notice fevers or flu-like symptoms with a rash. The rash may be red or purple and then turn into blisters or peeling of the skin. Or, you might notice a red rash with swelling of the face, lips, or lymph nodes in your neck or under your arms. This medication may affect blood sugar. Ask your care team if changes in diet or medications are needed if you have diabetes. Patients and their families should watch out for new or worsening depression or thoughts of suicide. This includes sudden changes in mood, behaviors, or thoughts. These changes can happen at any time but are more common in the beginning of treatment or after a change in dose. Call your care team right away if you experience these thoughts or worsening depression. Avoid alcoholic drinks while taking this medication. Drinking large amounts of alcoholic beverages, using sleeping or anxiety medications, or quickly stopping the use of these agents while taking this medication may increase your risk for a seizure. Do not drive or use heavy machinery until you know how this medication affects you. This medication can impair your ability to perform these tasks. Inform your care team if you wish to become pregnant or think you might be pregnant. Losing weight while pregnant is not advised and may cause harm to the unborn child. Talk to your care team for more information. What side effects may I notice from receiving this medication? Side effects that you should report to your care team as soon as possible: Allergic reactions--skin rash, itching, hives, swelling of the face, lips, tongue, or throat Fast or irregular heartbeat Increase in blood pressure Liver injury--right upper belly pain, loss of appetite, nausea,  light-colored stool, dark yellow or brown urine, yellowing skin or eyes, unusual weakness or fatigue Mood and behavior changes--anxiety, nervousness, confusion, hallucinations, irritability, hostility, thoughts of suicide or self-harm, worsening mood, feelings of depression Redness, blistering, peeling, or loosening of the skin, including inside the mouth Seizures Sudden eye pain or change in vision such as blurry vision, seeing halos around lights, vision loss Side effects that usually do not require medical attention (report to your care team if they continue or are bothersome): Constipation Dizziness Dry mouth Fatigue Headache Nausea Trouble sleeping Vomiting This list may not describe all possible side effects. Call your doctor for medical advice about side effects. You may report side effects to FDA at 1-800-FDA-1088. Where should I keep my medication? Keep out of the reach of children and pets. Store between 15 and 30 degrees C (59 and 86 degrees F). Get rid of any unused medication after the expiration date. To get rid of medications that are no longer needed or have expired: Take the medication to a medication take-back program. Check with your pharmacy or law enforcement to find a location. If you cannot return the medication, check the label or package insert to see if the medication should be thrown out in the garbage or flushed down the toilet. If you are not sure, ask your care team. If it is safe to put it in the trash, pour the medication out of the container. Mix the medication with cat litter, dirt, coffee grounds, or other unwanted substance. Seal the mixture in a bag or container. Put it in   the trash. NOTE: This sheet is a summary. It may not cover all possible information. If you have questions about this medicine, talk to your doctor, pharmacist, or health care provider.  2024 Elsevier/Gold Standard (2021-11-20 00:00:00)  

## 2023-06-19 NOTE — Progress Notes (Unsigned)
Subjective:  Patient ID: Jeanette Hernandez, female    DOB: 12-Jun-2001  Age: 22 y.o. MRN: [id number]  CC: Annual Exam   HPI Jeanette Hernandez is a 22 y.o. year old female with a history of asthma, menorrhagia here for an office visit.   Interval History: Discussed the use of AI scribe software for clinical note transcription with the patient, who gave verbal consent to proceed.   She presents with concerns about weight loss and the timing of her birth control pill. She reports a changing work schedule which has led to inconsistent timing of her birth control pill, sometimes taking it an hour or two later than usual. She denies missing any doses and is interested in exploring other birth control options if necessary.  Regarding weight loss, the patient is actively working out at the gym but feels she has plateaued and is not seeing the desired results. She expresses interest in weight loss medications like phentermine. She denies any current issues with her asthma and reports it is well-managed with albuterol as needed.        Past Medical History:  Diagnosis Date   Asthma    Eczema    as a child   Urticaria     No past surgical history on file.  Family History  Problem Relation Age of Onset   Asthma Father     Social History   Socioeconomic History   Marital status: Single    Spouse name: Not on file   Number of children: Not on file   Years of education: Not on file   Highest education level: Not on file  Occupational History   Not on file  Tobacco Use   Smoking status: Never    Passive exposure: Yes   Smokeless tobacco: Never  Vaping Use   Vaping status: Never Used  Substance and Sexual Activity   Alcohol use: No    Alcohol/week: 0.0 standard drinks of alcohol   Drug use: No   Sexual activity: Never    Birth control/protection: Abstinence  Other Topics Concern   Not on file  Social History Narrative   Not on file   Social Determinants of Health    Financial Resource Strain: Not on file  Food Insecurity: Not on file  Transportation Needs: Not on file  Physical Activity: Not on file  Stress: Not on file  Social Connections: Not on file    Allergies  Allergen Reactions   Fish Allergy Anaphylaxis    Such as Talapia   Not shellfish   Sulfa Antibiotics Anaphylaxis   Amoxicillin     Outpatient Medications Prior to Visit  Medication Sig Dispense Refill   albuterol (PROVENTIL) (2.5 MG/3ML) 0.083% nebulizer solution Inhale 3 mLs (2.5 mg total) by nebulization every 4 (four) hours as needed for wheezing or shortness of breath. 75 mL 1   [facility] ALLERGY 25 MG capsule   0   EPINEPHrine (EPIPEN 2-PAK) 0.3 mg/0.3 mL IJ SOAJ injection Inject 0.3 mg into the muscle as needed for anaphylaxis. 2 each 2   fexofenadine (ALLEGRA) 180 MG tablet Take 1 tablet (180 mg total) by mouth daily. 30 tablet 5   fluticasone (FLONASE) 50 MCG/ACT nasal spray Place 1 spray into both nostrils 2 (two) times daily as needed. 16 g 5   ibuprofen (ADVIL,MOTRIN) 800 MG tablet Take 1 tablet (800 mg total) by mouth 3 (three) times daily. 21 tablet 0   montelukast (SINGULAIR) 10 MG tablet Take 1 tablet (10  mg total) by mouth at bedtime. 30 tablet 5   Multiple Vitamins-Minerals (MULTIVITAMIN PO) Take by mouth.     albuterol (VENTOLIN HFA) 108 (90 Base) MCG/ACT inhaler Inhale 2 puffs into the lungs every 6 (six) hours as needed for wheezing or shortness of breath. 6.7 g 1   norethindrone-ethinyl estradiol-iron (BLISOVI FE 1.5/30) 1.5-30 MG-MCG tablet Take 1 tablet by mouth daily. 84 tablet 0   No facility-administered medications prior to visit.     ROS Review of Systems  Constitutional:  Negative for activity change and appetite change.  HENT:  Negative for sinus pressure and sore throat.   Respiratory:  Negative for chest tightness, shortness of breath and wheezing.   Cardiovascular:  Negative for chest pain and palpitations.  Gastrointestinal:  Negative for  abdominal distention, abdominal pain and constipation.  Genitourinary: Negative.   Musculoskeletal: Negative.   Psychiatric/Behavioral:  Negative for behavioral problems and dysphoric mood.     Objective:  BP 122/76   Pulse 87   Ht 5\' 3"  (1.6 m)   Wt (!) 324 lb (147 kg)   SpO2 99%   BMI 57.39 kg/m      06/19/2023    2:59 PM 05/21/2023   12:00 PM 05/21/2023   11:28 AM  BP/Weight  Systolic BP 122 113 142  Diastolic BP 76 70 76  Wt. (Lbs) 324  320  BMI 57.39 kg/m2  56.69 kg/m2      Physical Exam Constitutional:      Appearance: She is well-developed.  Cardiovascular:     Rate and Rhythm: Normal rate.     Heart sounds: Normal heart sounds. No murmur heard. Pulmonary:     Effort: Pulmonary effort is normal.     Breath sounds: Normal breath sounds. No wheezing or rales.  Chest:     Chest wall: No tenderness.  Abdominal:     General: Bowel sounds are normal. There is no distension.     Palpations: Abdomen is soft. There is no mass.     Tenderness: There is no abdominal tenderness.  Musculoskeletal:        General: Normal range of motion.     Right lower leg: No edema.     Left lower leg: No edema.  Neurological:     Mental Status: She is alert and oriented to person, place, and time.  Psychiatric:        Mood and Affect: Mood normal.        Latest Ref Rng & Units 05/21/2022    4:30 PM 07/13/2020    2:43 PM 09/18/2017    8:46 PM  CMP  Glucose 70 - 99 mg/dL 83  66  782   BUN 6 - 20 mg/dL 8  10  8    Creatinine 0.57 - 1.00 mg/dL 9.56  2.13  0.86   Sodium 134 - 144 mmol/L 145  138  135   Potassium 3.5 - 5.2 mmol/L 4.4  3.9  3.2   Chloride 96 - 106 mmol/L 109  99  104   CO2 20 - 29 mmol/L 20  23  25    Calcium 8.7 - 10.2 mg/dL 9.5  8.8  8.6   Total Protein 6.0 - 8.5 g/dL 7.0     Total Bilirubin 0.0 - 1.2 mg/dL <5.7     Alkaline Phos 44 - 121 IU/L 73     AST 0 - 40 IU/L 14     ALT 0 - 32 IU/L 18  Lipid Panel  No results found for: "CHOL", "TRIG", "HDL",  "CHOLHDL", "VLDL", "LDLCALC", "LDLDIRECT"  CBC    Component Value Date/Time   WBC 8.9 08/09/2022 1430   WBC 10.9 (H) 09/18/2017 2046   RBC 5.10 08/09/2022 1430   RBC 4.56 09/18/2017 2046   HGB 11.6 08/09/2022 1430   HCT 38.1 08/09/2022 1430   PLT 424 08/09/2022 1430   MCV 75 (L) 08/09/2022 1430   MCH 22.7 (L) 08/09/2022 1430   MCH 23.0 (L) 09/18/2017 2046   MCHC 30.4 (L) 08/09/2022 1430   MCHC 31.5 09/18/2017 2046   RDW 14.5 08/09/2022 1430   LYMPHSABS 2.2 08/09/2022 1430   EOSABS 0.1 08/09/2022 1430   BASOSABS 0.0 08/09/2022 1430    Lab Results  Component Value Date   HGBA1C 5.5 06/19/2023    Assessment & Plan:      Morbid obesity Patient expressed concerns about weight and interest in appetite suppressant medications. Last A1c was normal (5.5). Discussed various weight loss medications and their insurance coverage limitations.  -Would love to send prescription for Phentermine-Topiramate ER to pharmacy but efficacy of birth control pill will be affected by Topamax Prescription for Contrave sent to the pharmacy -Repeat A1c today is normal at 5.5 -Referral to weight management clinic   Contraception Patient on oral contraceptive pill, but has inconsistent timing of administration. Discussed the importance of consistent timing for effectiveness and options for long-acting reversible contraception (LARC) methods due to scheduling difficulties. -Continue current oral contraceptive regimen. -Consider switching to evening administration if more consistent. -Consider referral to Gynecology for LARC methods if pill timing remains inconsistent.  Asthma Patient reports good control with Albuterol as needed. -Continue current regimen.  General Health Maintenance -Order fasting labs including cholesterol at next visit in November. -Schedule Pap smear for November (due every 3 years, last one in 2021).          Meds ordered this encounter  Medications    norethindrone-ethinyl estradiol-iron (BLISOVI FE 1.5/30) 1.5-30 MG-MCG tablet    Sig: Take 1 tablet by mouth daily.    Dispense:  84 tablet    Refill:  3   albuterol (VENTOLIN HFA) 108 (90 Base) MCG/ACT inhaler    Sig: Inhale 2 puffs into the lungs every 6 (six) hours as needed for wheezing or shortness of breath.    Dispense:  6.7 g    Refill:  1   Naltrexone-buPROPion HCl ER 8-90 MG TB12    Sig: Start 1 tablet every morning for 7 days, then 1 tablet twice daily for 7 days, then 2 tablets every morning and one in the evening    Dispense:  120 tablet    Refill:  2    Follow-up: Return in about 3 months (around 09/18/2023) for Pap smear.       Hoy Register, MD, FAAFP. [facility] [place], [place] [phone]   06/19/2023, 4:30 PM

## 2023-06-20 ENCOUNTER — Other Ambulatory Visit: Payer: Self-pay

## 2023-06-20 ENCOUNTER — Other Ambulatory Visit (HOSPITAL_BASED_OUTPATIENT_CLINIC_OR_DEPARTMENT_OTHER): Payer: Self-pay

## 2023-06-20 ENCOUNTER — Other Ambulatory Visit (HOSPITAL_COMMUNITY): Payer: Self-pay

## 2023-06-21 ENCOUNTER — Other Ambulatory Visit (HOSPITAL_BASED_OUTPATIENT_CLINIC_OR_DEPARTMENT_OTHER): Payer: Self-pay

## 2023-06-24 ENCOUNTER — Telehealth: Payer: Self-pay

## 2023-06-24 NOTE — Telephone Encounter (Signed)
Pharmacy Patient Advocate Encounter   Received notification from CoverMyMeds that prior authorization for CONTRAVE is required/requested.   Insurance verification completed.   The patient is insured through [facility] .   Per test claim: PA required; PA submitted to [facility] via CoverMyMeds Key/confirmation #/EOC [id number] Status is pending

## 2023-06-26 ENCOUNTER — Other Ambulatory Visit: Payer: Self-pay

## 2023-06-26 NOTE — Telephone Encounter (Signed)
Pt is calling to f/u on status of PA for Naltrexone-buPROPion HCl ER 8-90 MG TB12.  Please advise.

## 2023-06-27 ENCOUNTER — Telehealth: Payer: Self-pay

## 2023-06-27 ENCOUNTER — Other Ambulatory Visit: Payer: Self-pay

## 2023-06-27 ENCOUNTER — Other Ambulatory Visit (HOSPITAL_BASED_OUTPATIENT_CLINIC_OR_DEPARTMENT_OTHER): Payer: Self-pay

## 2023-06-27 NOTE — Telephone Encounter (Signed)
Pharmacy Patient Advocate Encounter  Received notification from [facility] that Prior Authorization for CONTRAVE has been APPROVED from 06/26/2023 to 10/24/2023   PA #/Case ID/Reference #: [id number]

## 2023-06-28 ENCOUNTER — Ambulatory Visit: Payer: Self-pay

## 2023-06-28 ENCOUNTER — Encounter: Payer: Self-pay | Admitting: Family Medicine

## 2023-06-28 ENCOUNTER — Other Ambulatory Visit: Payer: Self-pay | Admitting: Family Medicine

## 2023-06-28 ENCOUNTER — Other Ambulatory Visit (HOSPITAL_BASED_OUTPATIENT_CLINIC_OR_DEPARTMENT_OTHER): Payer: Self-pay

## 2023-06-28 MED ORDER — NALTREXONE-BUPROPION HCL ER 8-90 MG PO TB12
2.0000 | ORAL_TABLET | Freq: Two times a day (BID) | ORAL | 2 refills | Status: DC
  Filled 2023-06-28 – 2023-08-05 (×2): qty 120, 30d supply, fill #0
  Filled 2023-09-19: qty 120, 30d supply, fill #1

## 2023-06-28 NOTE — Telephone Encounter (Signed)
Patient stated she already spoke to Cassandra at the office and all of her questions were answered. Patient declined triage at this time. Advised if she needed anything to callback. Patient verbalized understanding.  Summary: Contrave?   The patient called wanting to speak with her provider about the medication prescribed. The medicine is Contrave and this is a new medication for her. She has sent a message to her provider through the my chart and got a response back but she still doesn't quite understand. Please assist patient further

## 2023-07-01 ENCOUNTER — Telehealth: Payer: Self-pay

## 2023-07-01 NOTE — Telephone Encounter (Signed)
Spoke with patient. Given Clear Medication instructions from PCP for Taking Naltrexone-buPROPion HCl ER 8-90 MG TB12  Week 1: 1 tablet once a day morning Week 2: 1 tablet in the morning and 1 tablet in the evening Week 3: 2 tablets in the morning and 1 tablet in the evening Week 4: 2 tablets in the morning and 2 tablets in the evening -continue this indefinitely.  Patient is taking 1 tablet in the morning and 1 tablet in the evening.   Patient voices that she understands and will follow ramp up instructions.

## 2023-07-08 DIAGNOSIS — F4323 Adjustment disorder with mixed anxiety and depressed mood: Secondary | ICD-10-CM | POA: Insufficient documentation

## 2023-07-08 NOTE — BH Specialist Note (Signed)
Integrated Behavioral Health Follow Up In-Person Visit  MRN: [id number] Name: Jeanette Hernandez  Number of Integrated Behavioral Health Clinician visits: Additional Visit  Session Start time: 1600   Session End time: 1700  Total time in minutes: 60   Types of Service: Individual psychotherapy  Interpretor:No. Interpretor Name and Language: n/a  Subjective: Jeanette Hernandez is a 22 y.o. female accompanied by  herself. Patient was referred by PCP for anxiety and sadness. Patient reports the following symptoms/concerns: worrying a lot, sadness and self doubt Duration of problem: about a year ago; Severity of problem: mild  Objective: Mood: Euthymic and Affect: Appropriate Risk of harm to self or others: No plan to harm self or others  Life Context: Family and Social: lives with mother School/Work: currently working at nights Self-Care: reading affirmations and working out Life Changes: Patient was bullied a lot in high school this has affected her a lot and she had a panic attack almost a year ago  Patient and/or Family's Strengths/Protective Factors: Social connections, Concrete supports in place (healthy food, safe environments, etc.), and Sense of purpose  Goals Addressed: Patient will:  Reduce symptoms of: stress   Increase knowledge and/or ability of: stress reduction   Demonstrate ability to: Increase motivation to adhere to plan of care  Progress towards Goals: Revised and Ongoing  Interventions: Interventions utilized:  Supportive Reflection Standardized Assessments completed: GAD-7 and PHQ 9  Patient and/or Family Response: Patient has not been to therapy since March.  Patient shared that she was recently fired from her job.  Patient experienced a lot of conflict with her previous coworkers.  Patient shared that she will be starting a new job soon and is excited about this new adventure.  Patient is struggling to manage her weight loss and would like coping skills  for her weight loss journey and navigating her new job.    Patient Centered Plan: Patient is on the following Treatment Plan(s): Life Stressors  Assessment: Patient currently experiencing stress from leaving one job and starting a new one. Pt is upset that she has stopped losing weight due to not working out as much.  Patient may benefit from counseling from [facility] once every 2 months. Focusing on career path. Medication management for weight loss. Plan: Follow up with behavioral health clinician on : 4weeks Behavioral recommendations: exercise and health diet Referral(s): Integrated [employer] (In Clinic) "From scale of 1-10, how likely are you to follow plan?": most likely  Jeanette Hernandez, [place]

## 2023-07-23 ENCOUNTER — Other Ambulatory Visit (HOSPITAL_BASED_OUTPATIENT_CLINIC_OR_DEPARTMENT_OTHER): Payer: Self-pay

## 2023-08-05 ENCOUNTER — Other Ambulatory Visit (HOSPITAL_BASED_OUTPATIENT_CLINIC_OR_DEPARTMENT_OTHER): Payer: Self-pay

## 2023-08-05 MED FILL — Fluticasone Propionate Nasal Susp 50 MCG/ACT: NASAL | 30 days supply | Qty: 16 | Fill #1 | Status: AC

## 2023-08-09 ENCOUNTER — Other Ambulatory Visit (HOSPITAL_COMMUNITY): Payer: Self-pay

## 2023-08-09 ENCOUNTER — Other Ambulatory Visit (HOSPITAL_BASED_OUTPATIENT_CLINIC_OR_DEPARTMENT_OTHER): Payer: Self-pay

## 2023-08-09 ENCOUNTER — Other Ambulatory Visit: Payer: Self-pay | Admitting: Allergy

## 2023-08-09 MED ORDER — ALBUTEROL SULFATE (2.5 MG/3ML) 0.083% IN NEBU
2.5000 mg | INHALATION_SOLUTION | RESPIRATORY_TRACT | 1 refills | Status: DC | PRN
  Filled 2023-08-09 (×2): qty 75, 5d supply, fill #0

## 2023-08-14 ENCOUNTER — Ambulatory Visit: Payer: Commercial Managed Care - PPO | Admitting: Allergy

## 2023-08-16 ENCOUNTER — Ambulatory Visit (INDEPENDENT_AMBULATORY_CARE_PROVIDER_SITE_OTHER): Payer: Commercial Managed Care - PPO | Admitting: Allergy

## 2023-08-16 ENCOUNTER — Other Ambulatory Visit: Payer: Self-pay

## 2023-08-16 ENCOUNTER — Other Ambulatory Visit (HOSPITAL_BASED_OUTPATIENT_CLINIC_OR_DEPARTMENT_OTHER): Payer: Self-pay

## 2023-08-16 ENCOUNTER — Encounter: Payer: Self-pay | Admitting: Allergy

## 2023-08-16 VITALS — BP 126/72 | HR 77 | Temp 97.9°F | Wt 316.2 lb

## 2023-08-16 DIAGNOSIS — H1013 Acute atopic conjunctivitis, bilateral: Secondary | ICD-10-CM

## 2023-08-16 DIAGNOSIS — J3089 Other allergic rhinitis: Secondary | ICD-10-CM | POA: Diagnosis not present

## 2023-08-16 DIAGNOSIS — J4520 Mild intermittent asthma, uncomplicated: Secondary | ICD-10-CM

## 2023-08-16 DIAGNOSIS — T7800XD Anaphylactic reaction due to unspecified food, subsequent encounter: Secondary | ICD-10-CM

## 2023-08-16 MED ORDER — MONTELUKAST SODIUM 10 MG PO TABS
10.0000 mg | ORAL_TABLET | Freq: Every day | ORAL | 11 refills | Status: DC
  Filled 2023-08-16 – 2023-08-28 (×3): qty 30, 30d supply, fill #0
  Filled 2023-10-23: qty 30, 30d supply, fill #1
  Filled 2024-02-06 (×2): qty 30, 30d supply, fill #2
  Filled 2024-03-09: qty 30, 30d supply, fill #3
  Filled 2024-05-08: qty 30, 30d supply, fill #4
  Filled 2024-06-17: qty 30, 30d supply, fill #5
  Filled 2024-07-16: qty 30, 30d supply, fill #6

## 2023-08-16 MED ORDER — FLUTICASONE PROPIONATE 50 MCG/ACT NA SUSP
1.0000 | Freq: Two times a day (BID) | NASAL | 11 refills | Status: DC | PRN
  Filled 2023-08-16: qty 16, 30d supply, fill #0

## 2023-08-16 MED ORDER — EPINEPHRINE 0.3 MG/0.3ML IJ SOAJ
0.3000 mg | INTRAMUSCULAR | 2 refills | Status: DC | PRN
  Filled 2023-08-16: qty 2, 30d supply, fill #0
  Filled 2024-02-06: qty 2, 15d supply, fill #0
  Filled 2024-02-06: qty 2, 30d supply, fill #0

## 2023-08-16 MED ORDER — ALBUTEROL SULFATE HFA 108 (90 BASE) MCG/ACT IN AERS
2.0000 | INHALATION_SPRAY | Freq: Four times a day (QID) | RESPIRATORY_TRACT | 2 refills | Status: DC | PRN
  Filled 2023-08-16 – 2024-02-06 (×3): qty 6.7, 25d supply, fill #0

## 2023-08-16 MED ORDER — FEXOFENADINE HCL 180 MG PO TABS
180.0000 mg | ORAL_TABLET | Freq: Every day | ORAL | 11 refills | Status: AC
  Filled 2023-08-16: qty 30, 30d supply, fill #0

## 2023-08-16 NOTE — Patient Instructions (Addendum)
Allergic rhinitis Continue avoidance measures for mites and cats.   Continue Allegra (fexofenadine) daily at this time Continue Singulair (montelukast) 10mg  daily at night. May use Flonase (fluticasone) nasal spray 1 spray per nostril twice a day as needed for nasal congestion.  If having persistent runny nose you can use Astepro (this is OTC) nasal spray 2 sprays each nostril twice a day as needed For itchy/watery eyes can use as needed allergy based eyedrop (some OTC options would be Pataday, Alaway, Zaditor or Lastacaft)  Asthma Daily controller medication(s): Singulair (montelukast) 10mg  daily at night. May use albuterol rescue inhaler 2 puffs or nebulizer every 4 to 6 hours as needed for shortness of breath, chest tightness, coughing, and wheezing. May use albuterol rescue inhaler 2 puffs 5 to 15 minutes prior to strenuous physical activities. Monitor frequency of use.  Asthma control goals:  Full participation in all desired activities (may need albuterol before activity) Albuterol use two times or less a week on average (not counting use with activity) Cough interfering with sleep two times or less a month Oral steroids no more than once a year No hospitalizations  Food allergy Continue strict avoidance of fish. For mild symptoms you can take over the counter antihistamines such as Benadryl and monitor symptoms closely. If symptoms worsen or if you have severe symptoms including breathing issues, throat closure, significant swelling, whole body hives, severe diarrhea and vomiting, lightheadedness then inject epinephrine and seek immediate medical care afterwards. Action plan in place.   Follow up in 12 months or sooner if needed.

## 2023-08-16 NOTE — Progress Notes (Signed)
Follow-up Note  RE: Jeanette Hernandez MRN: [id number] DOB: May 26, 2001 Date of Office Visit: 08/16/2023   History of present illness: Jeanette Hernandez is a 22 y.o. female presenting today for follow-up of allergic rhinitis, asthma and food allergy.  She was last seen in the office on 02/13/2023 by myself.    Discussed the use of AI scribe software for clinical note transcription with the patient, who gave verbal consent to proceed.  She reports a recent episode of upper respiratory symptoms, including mucus production and a sore throat. She attributes these symptoms to weather changes and increased travel for work, rather than an illness, as she did not feel generally unwell. The patient experienced a temporary loss of voice for three days, which resolved with the use of a nebulizer and allergy medication, Allegra and Singulair.  She also uses a nasal spray, Flonase, approximately once every other day, or more frequently when experiencing increased stuffiness. She has been able to maintain her physical activity, including working out, without the need for a albuterol prior like she used to before. She reports no recent need for antibiotics or prednisone.  The only time she has used her rescue inhaler was with the symptoms above. 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 She is diligent in avoiding fish and cross-contamination, and has not had a significant allergic reaction since middle school. She carries an EpiPen for emergency use, although it has not been needed.  She does have some interest in potentially eating a salmon if she becomes nonallergic in the future.  She states however the last time she had testing in 2019 she was told by the allergist she was seeing then that her allergy have worsened based off of the testing.      Review of systems: 10pt ROS negative unless noted above in HPI   All other systems negative unless noted above in HPI  Past medical/social/surgical/family history have been  reviewed and are unchanged unless specifically indicated below.  No changes  Medication List: Current Outpatient Medications  Medication Sig Dispense Refill   albuterol (PROVENTIL) (2.5 MG/3ML) 0.083% nebulizer solution Inhale 3 mLs (2.5 mg total) by nebulization every 4 (four) hours as needed for wheezing or shortness of breath. 75 mL 1   albuterol (VENTOLIN HFA) 108 (90 Base) MCG/ACT inhaler Inhale 2 puffs into the lungs every 6 (six) hours as needed for wheezing or shortness of breath. 6.7 g 1   EPINEPHrine (EPIPEN 2-PAK) 0.3 mg/0.3 mL IJ SOAJ injection Inject 0.3 mg into the muscle as needed for anaphylaxis. 2 each 2   fexofenadine (ALLEGRA) 180 MG tablet Take 1 tablet (180 mg total) by mouth daily. 30 tablet 5   fluticasone (FLONASE) 50 MCG/ACT nasal spray Place 1 spray into both nostrils 2 (two) times daily as needed. 16 g 5   ibuprofen (ADVIL,MOTRIN) 800 MG tablet Take 1 tablet (800 mg total) by mouth 3 (three) times daily. 21 tablet 0   montelukast (SINGULAIR) 10 MG tablet Take 1 tablet (10 mg total) by mouth at bedtime. 30 tablet 5   Multiple Vitamins-Minerals (MULTIVITAMIN PO) Take by mouth.     Naltrexone-buPROPion HCl ER 8-90 MG TB12 Take 2 tablets by mouth 2 (two) times daily. 120 tablet 2   norethindrone-ethinyl estradiol-iron (BLISOVI FE 1.5/30) 1.5-30 MG-MCG tablet Take 1 tablet by mouth daily. 84 tablet 3   [facility] ALLERGY 25 MG capsule  (Patient not taking: Reported on 08/16/2023)  0   No current facility-administered medications for  this visit.     Known medication allergies: Allergies  Allergen Reactions   Fish Allergy Anaphylaxis    Such as Talapia   Not shellfish   Sulfa Antibiotics Anaphylaxis   Amoxicillin      Physical examination: Blood pressure 126/72, pulse 77, temperature 97.9 F (36.6 C), temperature source Temporal, weight (!) 316 lb 3.2 oz (143.4 kg), SpO2 97%.  General: Alert, interactive, in no acute distress. HEENT: PERRLA, TMs pearly gray,  turbinates non-edematous without discharge, post-pharynx non erythematous. Neck: Supple without lymphadenopathy. Lungs: Clear to auscultation without wheezing, rhonchi or rales. {no increased work of breathing. CV: Normal S1, S2 without murmurs. Abdomen: Nondistended, nontender. Skin: Warm and dry, without lesions or rashes. Extremities:  No clubbing, cyanosis or edema. Neuro:   Grossly intact.  Diagnositics/Labs: None today  Assessment and plan:   Allergic rhinitis Continue avoidance measures for mites and cats.   Continue Allegra (fexofenadine) daily at this time Continue Singulair (montelukast) 10mg  daily at night. May use Flonase (fluticasone) nasal spray 1 spray per nostril twice a day as needed for nasal congestion.  If having persistent runny nose you can use Astepro (this is OTC) nasal spray 2 sprays each nostril twice a day as needed For itchy/watery eyes can use as needed allergy based eyedrop (some OTC options would be Pataday, Alaway, Zaditor or Lastacaft)  Asthma Daily controller medication(s): Singulair (montelukast) 10mg  daily at night. May use albuterol rescue inhaler 2 puffs or nebulizer every 4 to 6 hours as needed for shortness of breath, chest tightness, coughing, and wheezing. May use albuterol rescue inhaler 2 puffs 5 to 15 minutes prior to strenuous physical activities. Monitor frequency of use.  Asthma control goals:  Full participation in all desired activities (may need albuterol before activity) Albuterol use two times or less a week on average (not counting use with activity) Cough interfering with sleep two times or less a month Oral steroids no more than once a year No hospitalizations  Food allergy Continue strict avoidance of fish. For mild symptoms you can take over the counter antihistamines such as Benadryl and monitor symptoms closely. If symptoms worsen or if you have severe symptoms including breathing issues, throat closure, significant  swelling, whole body hives, severe diarrhea and vomiting, lightheadedness then inject epinephrine and seek immediate medical care afterwards. Action plan in place.  Discussed the option of getting serum IgE levels to fish and seeing where that stands if she is interested in potentially moving forward with a food challenge if able  Follow up in 12 months or sooner if needed.     I appreciate the opportunity to take part in [place] care. Please do not hesitate to contact me with questions.  Sincerely,   Margo Aye, MD Allergy/Immunology [facility]

## 2023-08-22 ENCOUNTER — Encounter (HOSPITAL_BASED_OUTPATIENT_CLINIC_OR_DEPARTMENT_OTHER): Payer: Self-pay | Admitting: Emergency Medicine

## 2023-08-22 ENCOUNTER — Other Ambulatory Visit: Payer: Self-pay

## 2023-08-22 ENCOUNTER — Emergency Department (HOSPITAL_BASED_OUTPATIENT_CLINIC_OR_DEPARTMENT_OTHER): Payer: Commercial Managed Care - PPO | Admitting: Radiology

## 2023-08-22 ENCOUNTER — Emergency Department (HOSPITAL_BASED_OUTPATIENT_CLINIC_OR_DEPARTMENT_OTHER)
Admission: EM | Admit: 2023-08-22 | Discharge: 2023-08-22 | Disposition: A | Discharge status: Home / Self Care | Payer: Commercial Managed Care - PPO | Attending: Emergency Medicine | Admitting: Emergency Medicine

## 2023-08-22 DIAGNOSIS — M79605 Pain in left leg: Secondary | ICD-10-CM | POA: Insufficient documentation

## 2023-08-22 DIAGNOSIS — W19XXXA Unspecified fall, initial encounter: Secondary | ICD-10-CM | POA: Insufficient documentation

## 2023-08-22 DIAGNOSIS — R102 Pelvic and perineal pain: Secondary | ICD-10-CM | POA: Diagnosis not present

## 2023-08-22 MED ORDER — IBUPROFEN 800 MG PO TABS: 800.0000 mg | ORAL_TABLET | Freq: Once | ORAL | Status: DC

## 2023-08-22 MED ORDER — ACETAMINOPHEN 500 MG PO TABS
1000.0000 mg | ORAL_TABLET | Freq: Once | ORAL | Status: AC
  Administered 2023-08-22: 1000 mg via ORAL
  Filled 2023-08-22: qty 2

## 2023-08-22 MED ORDER — LIDOCAINE 5 % EX PTCH
1.0000 | MEDICATED_PATCH | CUTANEOUS | Status: DC
  Administered 2023-08-22: 1 via TRANSDERMAL
  Filled 2023-08-22: qty 1

## 2023-08-22 NOTE — ED Notes (Signed)
Called xray to ask why patient has not been taken back for films. Radiology had questions about the order. I spoke with Jonny Ruiz, [place] and orders will be adjusted per Rad department verbal per Jonny Ruiz, [place].

## 2023-08-22 NOTE — Discharge Instructions (Addendum)
Evaluation today was overall reassuring.  Your x-ray was negative for acute injury.  Recommend rest, ice and tylenol.  Also recommend he follow-up PCP if your symptoms persist.

## 2023-08-22 NOTE — ED Triage Notes (Signed)
Pt endorses "did a full split" when attempting to stretch. Pt c/o BLE pain, LT side worse painn with decreased ROM

## 2023-08-22 NOTE — ED Notes (Signed)
Patient given ginger ale upon request and Nathaniel Man permission.

## 2023-08-22 NOTE — ED Provider Notes (Addendum)
[place] EMERGENCY DEPARTMENT AT [facility] Provider Note   CSN: [id number] Arrival date & time: 08/22/23  1015     History  Chief Complaint  Patient presents with   Leg Pain   HPI Jeanette Hernandez is a 22 y.o. female presenting for hip and leg pain. States last night she was stretching in a squat position ended up doing a "full split".  She states she felt a "pop" in both her hips. Now endorsing pain in the left hip. Still able to ambulate and bear weight.  She also mention she has some pain about the anterior aspect of both knees.  Denies back pain.   Leg Pain      Home Medications Prior to Admission medications   Medication Sig Start Date End Date Taking? Authorizing Provider  albuterol (PROVENTIL) (2.5 MG/3ML) 0.083% nebulizer solution Inhale 3 mLs (2.5 mg total) by nebulization every 4 (four) hours as needed for wheezing or shortness of breath. 08/09/23   Marcelyn Bruins, MD  albuterol (VENTOLIN HFA) 108 (90 Base) MCG/ACT inhaler Inhale 2 puffs into the lungs every 6 (six) hours as needed for wheezing or shortness of breath. 08/16/23   Marcelyn Bruins, MD  EPINEPHrine (EPIPEN 2-PAK) 0.3 mg/0.3 mL IJ SOAJ injection Inject 0.3 mg into the muscle as needed for anaphylaxis. 08/16/23   Marcelyn Bruins, MD  fexofenadine (ALLEGRA) 180 MG tablet Take 1 tablet (180 mg total) by mouth daily. 08/16/23   Marcelyn Bruins, MD  fluticasone (FLONASE) 50 MCG/ACT nasal spray Place 1 spray into both nostrils 2 (two) times daily as needed. 08/16/23   Marcelyn Bruins, MD  ibuprofen (ADVIL,MOTRIN) 800 MG tablet Take 1 tablet (800 mg total) by mouth 3 (three) times daily. 09/19/17   Roxy Horseman, PA-C  montelukast (SINGULAIR) 10 MG tablet Take 1 tablet (10 mg total) by mouth at bedtime. 08/16/23   Marcelyn Bruins, MD  Multiple Vitamins-Minerals (MULTIVITAMIN PO) Take by mouth.    [provider]  Naltrexone-buPROPion HCl ER  8-90 MG TB12 Take 2 tablets by mouth 2 (two) times daily. 06/28/23   Hoy Register, MD  norethindrone-ethinyl estradiol-iron (BLISOVI FE 1.5/30) 1.5-30 MG-MCG tablet Take 1 tablet by mouth daily. 06/19/23 06/18/24  Hoy Register, MD      Allergies    Fish allergy, Sulfa antibiotics, and Amoxicillin    Review of Systems   See HPI for pertinent positives  Physical Exam Updated Vital Signs BP (!) 101/56   Pulse 68   Temp 98.7 F (37.1 C) (Oral)   Resp 18   Wt (!) 138.3 kg   LMP 07/29/2023   SpO2 100%   BMI 54.03 kg/m  Physical Exam Constitutional:      Appearance: Normal appearance.  HENT:     Head: Normocephalic.     Nose: Nose normal.  Eyes:     Conjunctiva/sclera: Conjunctivae normal.  Pulmonary:     Effort: Pulmonary effort is normal.  Musculoskeletal:     Left hip: Tenderness present. No bony tenderness. Decreased range of motion.  Neurological:     Mental Status: She is alert.  Psychiatric:        Mood and Affect: Mood normal.     ED Results / Procedures / Treatments   Labs (all labs ordered are listed, but only abnormal results are displayed) Labs Reviewed - No data to display  EKG None  Radiology DG HIPS BILAT WITH PELVIS MIN 5 VIEWS  Result Date: 08/22/2023 CLINICAL DATA:  Pelvic pain. EXAM: DG HIP (WITH OR WITHOUT PELVIS) 5+V BILAT COMPARISON:  None Available. FINDINGS: There is no evidence of hip fracture or dislocation. There is no evidence of arthropathy or other focal bone abnormality. IMPRESSION: Negative. Electronically Signed   By: Lupita Raider M.D.   On: 08/22/2023 15:59    Procedures Procedures    Medications Ordered in ED Medications  lidocaine (LIDODERM) 5 % 1 patch (has no administration in time range)  acetaminophen (TYLENOL) tablet 1,000 mg (has no administration in time range)    ED Course/ Medical Decision Making/ A&P                                 Medical Decision Making Amount and/or Complexity of Data  Reviewed Radiology: ordered.  Risk OTC drugs. Prescription drug management.   22 year old well-appearing female presenting for left-sided leg pain after a fall.  Exam notable for some tenderness with internal and external rotation of the left hip. DDx includes fracture, dislocation, MSK, other.  X-ray was negative for acute injury.  Suspect muscular injury.  Advised conservative treatment at home.  Advised to follow-up with her PCP if her symptoms persist.  Also encouraged using ice and Tylenol for pain as patient states she has an intolerance to NSAIDs. Discussed return precautions.  Vital stable.  Discharged in good condition.        Final Clinical Impression(s) / ED Diagnoses Final diagnoses:  Left leg pain    Rx / DC Orders ED Discharge Orders     None         Gareth Eagle, PA-C 08/22/23 1606    Gareth Eagle, PA-C 08/22/23 1612    Horton, Clabe Seal, DO 08/27/23 1215

## 2023-08-22 NOTE — ED Notes (Signed)
Pt requesting to eat and drink something prior to dc Ginger ale and crackers given

## 2023-08-28 ENCOUNTER — Other Ambulatory Visit: Payer: Self-pay

## 2023-08-28 ENCOUNTER — Other Ambulatory Visit (HOSPITAL_BASED_OUTPATIENT_CLINIC_OR_DEPARTMENT_OTHER): Payer: Self-pay

## 2023-09-18 ENCOUNTER — Ambulatory Visit: Payer: Commercial Managed Care - PPO | Admitting: Family Medicine

## 2023-09-18 ENCOUNTER — Telehealth: Payer: Self-pay

## 2023-09-18 NOTE — Telephone Encounter (Signed)
Noted  

## 2023-09-18 NOTE — Telephone Encounter (Signed)
Copied from CRM [id number]. Topic: General - Other >> Sep 18, 2023 11:11 AM Franchot Heidelberg wrote: Reason for CRM: Pt wants to know if its okay that she rescheduled her appt for today to the next available in February.

## 2023-09-19 ENCOUNTER — Other Ambulatory Visit (HOSPITAL_BASED_OUTPATIENT_CLINIC_OR_DEPARTMENT_OTHER): Payer: Self-pay

## 2023-09-21 ENCOUNTER — Encounter: Payer: Self-pay | Admitting: Family Medicine

## 2023-10-03 DIAGNOSIS — H5213 Myopia, bilateral: Secondary | ICD-10-CM | POA: Diagnosis not present

## 2023-11-15 ENCOUNTER — Encounter: Payer: Self-pay | Admitting: Family Medicine

## 2023-12-04 ENCOUNTER — Ambulatory Visit: Payer: Commercial Managed Care - PPO | Attending: Family Medicine | Admitting: Family Medicine

## 2023-12-04 ENCOUNTER — Other Ambulatory Visit (HOSPITAL_COMMUNITY)
Admission: RE | Admit: 2023-12-04 | Discharge: 2023-12-04 | Disposition: A | Discharge status: Home / Self Care | Source: Ambulatory Visit | Attending: Family Medicine | Admitting: Family Medicine

## 2023-12-04 ENCOUNTER — Encounter: Payer: Self-pay | Admitting: Family Medicine

## 2023-12-04 VITALS — BP 132/76 | HR 72 | Ht 63.0 in | Wt 329.8 lb

## 2023-12-04 DIAGNOSIS — Z113 Encounter for screening for infections with a predominantly sexual mode of transmission: Secondary | ICD-10-CM | POA: Diagnosis present

## 2023-12-04 DIAGNOSIS — Z0001 Encounter for general adult medical examination with abnormal findings: Secondary | ICD-10-CM

## 2023-12-04 DIAGNOSIS — Z124 Encounter for screening for malignant neoplasm of cervix: Secondary | ICD-10-CM | POA: Diagnosis not present

## 2023-12-04 DIAGNOSIS — Z6841 Body Mass Index (BMI) 40.0 and over, adult: Secondary | ICD-10-CM

## 2023-12-04 DIAGNOSIS — Z13228 Encounter for screening for other metabolic disorders: Secondary | ICD-10-CM

## 2023-12-04 NOTE — Progress Notes (Signed)
 Subjective:  Patient ID: Jeanette Hernandez, female    DOB: [birth year]  Age: 23 y.o. MRN: [id number]  CC: Gynecologic Exam (Discuss weight loss injections)   HPI: Jeanette Hernandez is a 23 y.o. year old female with a medical history significant for  asthma, menorrhagia here for an annual visit.  Discussed the use of AI scribe software for clinical note transcription with the patient, who gave verbal consent to proceed.  History of Present Illness She presents with concerns about weight management. She expresses interest in weight loss injections that are compatible with her birth control. She has previously tried [employer], a weight loss medication, but discontinued it a month ago due to perceived lack of efficacy. Despite regular exercise (three to four days a week) and dietary changes, including intermittent fasting, the patient has experienced fluctuations in weight. She reports a weight loss of about 20 pounds since September of the previous year, but recent weight gain likely due to the holiday season.  The patient's mother, a phlebotomist, has been monitoring her A1c levels, which have remained within normal limits. The patient also mentions a recent dental procedure and a concern about a potential cavity in a wisdom tooth.    Past Medical History:  Diagnosis Date   Asthma    Eczema    as a child   Urticaria     No past surgical history on file.  Family History  Problem Relation Age of Onset   Asthma Father     Social History   Socioeconomic History   Marital status: Single    Spouse name: Not on file   Number of children: Not on file   Years of education: Not on file   Highest education level: Not on file  Occupational History   Not on file  Tobacco Use   Smoking status: Never    Passive exposure: Yes   Smokeless tobacco: Never  Vaping Use   Vaping status: Never Used  Substance and Sexual Activity   Alcohol use: No    Alcohol/week: 0.0 standard drinks of alcohol    Drug use: No   Sexual activity: Never    Birth control/protection: Abstinence  Other Topics Concern   Not on file  Social History Narrative   Not on file   Social Drivers of Health   Financial Resource Strain: Not on file  Food Insecurity: Not on file  Transportation Needs: Not on file  Physical Activity: Not on file  Stress: Not on file  Social Connections: Not on file    Allergies  Allergen Reactions   Fish Allergy Anaphylaxis    Such as Talapia   Not shellfish   Sulfa Antibiotics Anaphylaxis   Amoxicillin     Outpatient Medications Prior to Visit  Medication Sig Dispense Refill   albuterol (PROVENTIL) (2.5 MG/3ML) 0.083% nebulizer solution Inhale 3 mLs (2.5 mg total) by nebulization every 4 (four) hours as needed for wheezing or shortness of breath. 75 mL 1   albuterol (VENTOLIN HFA) 108 (90 Base) MCG/ACT inhaler Inhale 2 puffs into the lungs every 6 (six) hours as needed for wheezing or shortness of breath. 6.7 g 2   EPINEPHrine (EPIPEN 2-PAK) 0.3 mg/0.3 mL IJ SOAJ injection Inject 0.3 mg into the muscle as needed for anaphylaxis. 2 each 2   fexofenadine (ALLEGRA) 180 MG tablet Take 1 tablet (180 mg total) by mouth daily. 30 tablet 11   fluticasone (FLONASE) 50 MCG/ACT nasal spray Place 1 spray into both nostrils 2 (  two) times daily as needed. 16 g 11   ibuprofen (ADVIL,MOTRIN) 800 MG tablet Take 1 tablet (800 mg total) by mouth 3 (three) times daily. 21 tablet 0   montelukast (SINGULAIR) 10 MG tablet Take 1 tablet (10 mg total) by mouth at bedtime. 30 tablet 11   Multiple Vitamins-Minerals (MULTIVITAMIN PO) Take by mouth.     norethindrone-ethinyl estradiol-iron (BLISOVI FE 1.5/30) 1.5-30 MG-MCG tablet Take 1 tablet by mouth daily. 84 tablet 3   Naltrexone-buPROPion HCl ER 8-90 MG TB12 Take 2 tablets by mouth 2 (two) times daily. (Patient not taking: Reported on 12/04/2023) 120 tablet 2   No facility-administered medications prior to visit.     ROS Review of Systems   Constitutional:  Negative for activity change, appetite change and fatigue.  HENT:  Negative for congestion, sinus pressure and sore throat.   Eyes:  Negative for visual disturbance.  Respiratory:  Negative for cough, chest tightness, shortness of breath and wheezing.   Cardiovascular:  Negative for chest pain and palpitations.  Gastrointestinal:  Negative for abdominal distention, abdominal pain and constipation.  Endocrine: Negative for polydipsia.  Genitourinary:  Negative for dysuria and frequency.  Musculoskeletal:  Negative for arthralgias and back pain.  Skin:  Negative for rash.  Neurological:  Negative for tremors, light-headedness and numbness.  Hematological:  Does not bruise/bleed easily.  Psychiatric/Behavioral:  Negative for agitation and behavioral problems.     Objective:  BP 132/76   Pulse 72   Ht 5\' 3"  (1.6 m)   Wt (!) 329 lb 12.8 oz (149.6 kg)   SpO2 100%   BMI 58.42 kg/m      12/04/2023    4:09 PM 08/22/2023    4:00 PM 08/22/2023    3:30 PM  BP/Weight  Systolic BP 132 109 101  Diastolic BP 76 59 56  Wt. (Lbs) 329.8    BMI 58.42 kg/m2      Wt Readings from Last 3 Encounters:  12/04/23 (!) 329 lb 12.8 oz (149.6 kg)  08/22/23 (!) 305 lb (138.3 kg)  08/16/23 (!) 316 lb 3.2 oz (143.4 kg)     Physical Exam Exam conducted with a chaperone present.  Constitutional:      General: She is not in acute distress.    Appearance: She is well-developed. She is obese. She is not diaphoretic.  HENT:     Head: Normocephalic.     Right Ear: External ear normal.     Left Ear: External ear normal.     Nose: Nose normal.  Eyes:     Conjunctiva/sclera: Conjunctivae normal.     Pupils: Pupils are equal, round, and reactive to light.  Neck:     Vascular: No JVD.  Cardiovascular:     Rate and Rhythm: Normal rate and regular rhythm.     Heart sounds: Normal heart sounds. No murmur heard.    No gallop.  Pulmonary:     Effort: Pulmonary effort is normal. No  respiratory distress.     Breath sounds: Normal breath sounds. No wheezing or rales.  Chest:     Chest wall: No tenderness.  Breasts:    Right: Normal. No mass, nipple discharge or tenderness.     Left: Normal. No mass, nipple discharge or tenderness.  Abdominal:     General: Bowel sounds are normal. There is no distension.     Palpations: Abdomen is soft. There is no mass.     Tenderness: There is no abdominal tenderness.  Hernia: There is no hernia in the left inguinal area or right inguinal area.  Genitourinary:    General: Normal vulva.     Pubic Area: No rash.      Labia:        Right: No rash.        Left: No rash.      Vagina: Normal.     Cervix: Normal.     Uterus: Normal.      Adnexa: Right adnexa normal and left adnexa normal.       Right: No tenderness.         Left: No tenderness.    Musculoskeletal:        General: No tenderness. Normal range of motion.     Cervical back: Normal range of motion. No tenderness.  Lymphadenopathy:     Upper Body:     Right upper body: No supraclavicular or axillary adenopathy.     Left upper body: No supraclavicular or axillary adenopathy.  Skin:    General: Skin is warm and dry.  Neurological:     Mental Status: She is alert and oriented to person, place, and time.     Deep Tendon Reflexes: Reflexes are normal and symmetric.        Latest Ref Rng & Units 05/21/2022    4:30 PM 07/13/2020    2:43 PM 09/18/2017    8:46 PM  CMP  Glucose 70 - 99 mg/dL 83  66  295   BUN 6 - 20 mg/dL 8  10  8    Creatinine 0.57 - 1.00 mg/dL 6.21  3.08  6.57   Sodium 134 - 144 mmol/L 145  138  135   Potassium 3.5 - 5.2 mmol/L 4.4  3.9  3.2   Chloride 96 - 106 mmol/L 109  99  104   CO2 20 - 29 mmol/L 20  23  25    Calcium 8.7 - 10.2 mg/dL 9.5  8.8  8.6   Total Protein 6.0 - 8.5 g/dL 7.0     Total Bilirubin 0.0 - 1.2 mg/dL <8.4     Alkaline Phos 44 - 121 IU/L 73     AST 0 - 40 IU/L 14     ALT 0 - 32 IU/L 18       Lipid Panel  No results  found for: "CHOL", "TRIG", "HDL", "CHOLHDL", "VLDL", "LDLCALC", "LDLDIRECT"  CBC    Component Value Date/Time   WBC 8.9 08/09/2022 1430   WBC 10.9 (H) 09/18/2017 2046   RBC 5.10 08/09/2022 1430   RBC 4.56 09/18/2017 2046   HGB 11.6 08/09/2022 1430   HCT 38.1 08/09/2022 1430   PLT 424 08/09/2022 1430   MCV 75 (L) 08/09/2022 1430   MCH 22.7 (L) 08/09/2022 1430   MCH 23.0 (L) 09/18/2017 2046   MCHC 30.4 (L) 08/09/2022 1430   MCHC 31.5 09/18/2017 2046   RDW 14.5 08/09/2022 1430   LYMPHSABS 2.2 08/09/2022 1430   EOSABS 0.1 08/09/2022 1430   BASOSABS 0.0 08/09/2022 1430    Lab Results  Component Value Date   HGBA1C 5.5 06/19/2023    Assessment & Plan:   Assessment & Plan Annual Physical exam with abnormal findings: -Counseled on 150 minutes of exercise per week, healthy eating (including decreased daily intake of saturated fats, cholesterol, added sugars, sodium),  routine healthcare maintenance.   Screening for cervical cancer -Pap smear performed  Screening for STDs -Cervicovaginal ancillary testing ordered   Obesity Patient has been struggling with  weight loss and has tried Contrave in the past with limited success. Discussed the potential benefits of GLP1RA injections, but noted the difficulty in obtaining insurance approval for patients without diabetes. Patient has been exercising 3-4 days per week and has made dietary changes. -Refer to weight management clinic for further evaluation and potential initiation of weight loss injections. -Resume Contrave at 2 tablets twice daily.  General Health Maintenance -Check [facility] registry for HPV vaccine status - completed vaccine series -Order labs for kidney function, liver function, cholesterol, and blood count. -Plan to follow up on Pap smear results via MyChart.      No orders of the defined types were placed in this encounter.   Follow-up: Return in about 1 year (around 12/03/2024) for CPE/ Preventive  Health Exam.       Hoy Register, MD, FAAFP. [facility] [place], [place] [phone]   12/04/2023, 5:17 PM

## 2023-12-05 ENCOUNTER — Encounter: Payer: Commercial Managed Care - PPO | Admitting: Bariatrics

## 2023-12-06 ENCOUNTER — Encounter: Payer: Self-pay | Admitting: Family Medicine

## 2023-12-06 LAB — CYTOLOGY - PAP: Diagnosis: NEGATIVE

## 2023-12-08 LAB — CERVICOVAGINAL ANCILLARY ONLY
Bacterial Vaginitis (gardnerella): NEGATIVE
Candida Glabrata: NEGATIVE
Candida Vaginitis: POSITIVE — AB
Chlamydia: NEGATIVE
Comment: NEGATIVE
Comment: NEGATIVE
Comment: NEGATIVE
Comment: NEGATIVE
Comment: NEGATIVE
Comment: NORMAL
Neisseria Gonorrhea: NEGATIVE
Trichomonas: NEGATIVE

## 2023-12-09 ENCOUNTER — Other Ambulatory Visit (HOSPITAL_BASED_OUTPATIENT_CLINIC_OR_DEPARTMENT_OTHER): Payer: Self-pay

## 2023-12-09 ENCOUNTER — Ambulatory Visit (INDEPENDENT_AMBULATORY_CARE_PROVIDER_SITE_OTHER): Payer: Commercial Managed Care - PPO | Admitting: Bariatrics

## 2023-12-09 ENCOUNTER — Encounter: Payer: Commercial Managed Care - PPO | Admitting: Bariatrics

## 2023-12-09 ENCOUNTER — Encounter: Payer: Self-pay | Admitting: Bariatrics

## 2023-12-09 ENCOUNTER — Other Ambulatory Visit: Payer: Self-pay | Admitting: Family Medicine

## 2023-12-09 VITALS — BP 129/82 | HR 74 | Temp 97.9°F | Ht 64.5 in | Wt 326.0 lb

## 2023-12-09 DIAGNOSIS — Z6841 Body Mass Index (BMI) 40.0 and over, adult: Secondary | ICD-10-CM

## 2023-12-09 DIAGNOSIS — Z833 Family history of diabetes mellitus: Secondary | ICD-10-CM

## 2023-12-09 DIAGNOSIS — D563 Thalassemia minor: Secondary | ICD-10-CM

## 2023-12-09 DIAGNOSIS — E66813 Obesity, class 3: Secondary | ICD-10-CM

## 2023-12-09 DIAGNOSIS — J4520 Mild intermittent asthma, uncomplicated: Secondary | ICD-10-CM

## 2023-12-09 DIAGNOSIS — Z0289 Encounter for other administrative examinations: Secondary | ICD-10-CM

## 2023-12-09 MED ORDER — FLUCONAZOLE 150 MG PO TABS
150.0000 mg | ORAL_TABLET | Freq: Once | ORAL | 0 refills | Status: AC
  Filled 2023-12-09: qty 1, 1d supply, fill #0

## 2023-12-09 NOTE — Progress Notes (Signed)
 Office: [phone]  /  Fax: [phone]   Initial Visit  Jeanette Hernandez was seen in clinic today to evaluate for obesity. She is interested in losing weight to improve overall health and reduce the risk of weight related complications. She presents today to review program treatment options, initial physical assessment, and evaluation.     She was referred by: PCP  When asked what else they would like to accomplish? She states: Adopt healthier eating patterns, Improve energy levels and physical activity, and Improve quality of life  When asked how has your weight affected you? She states: Having fatigue and Having poor endurance  Some associated conditions: Diabetes on the paternal side ( GM ), and asthma.   Contributing factors: Family history of obesity and Slow metabolism for age  Weight promoting medications identified: Contraceptives or hormonal therapy  Current nutrition plan: Portion control / smart choices and Other: Slim Fast  Intermittent fasting.   Current level of physical activity: None, Walking as tolerated , and Step counting 8,000 to 10,000  Current or previous pharmacotherapy: Other: Contrave  Response to medication: Ineffective so it was discontinued   Past medical history includes:   Past Medical History:  Diagnosis Date   Asthma    Eczema    as a child   Urticaria      Objective:   BP 129/82   Pulse 74   Temp 97.9 F (36.6 C)   Ht 5' 4.5" (1.638 m)   Wt (!) 326 lb (147.9 kg)   SpO2 100%   BMI 55.09 kg/m  She was weighed on the bioimpedance scale: Body mass index is 55.09 kg/m.  Peak Weight:326 lbs , Body Fat%:54.2 %, Visceral Fat Rating:19, Weight trend over the last 12 months: Increasing  General:  Alert, oriented and cooperative. Patient is in no acute distress.  Respiratory: Normal respiratory effort, no problems with respiration noted  Extremities: Normal range of motion.    Mental Status: Normal mood and affect. Normal behavior.  Normal judgment and thought content.   DIAGNOSTIC DATA REVIEWED:  BMET    Component Value Date/Time   NA 145 (H) 05/21/2022 1630   K 4.4 05/21/2022 1630   CL 109 (H) 05/21/2022 1630   CO2 20 05/21/2022 1630   GLUCOSE 83 05/21/2022 1630   GLUCOSE 114 (H) 09/18/2017 2046   BUN 8 05/21/2022 1630   CREATININE 0.81 05/21/2022 1630   CALCIUM 9.5 05/21/2022 1630   GFRNONAA 127 07/13/2020 1443   GFRAA 147 07/13/2020 1443   Lab Results  Component Value Date   HGBA1C 5.5 06/19/2023   HGBA1C 5.5 07/13/2020   No results found for: "INSULIN" CBC    Component Value Date/Time   WBC 8.9 08/09/2022 1430   WBC 10.9 (H) 09/18/2017 2046   RBC 5.10 08/09/2022 1430   RBC 4.56 09/18/2017 2046   HGB 11.6 08/09/2022 1430   HCT 38.1 08/09/2022 1430   PLT 424 08/09/2022 1430   MCV 75 (L) 08/09/2022 1430   MCH 22.7 (L) 08/09/2022 1430   MCH 23.0 (L) 09/18/2017 2046   MCHC 30.4 (L) 08/09/2022 1430   MCHC 31.5 09/18/2017 2046   RDW 14.5 08/09/2022 1430   Iron/TIBC/Ferritin/ %Sat    Component Value Date/Time   IRON 100 08/09/2022 1430   TIBC 325 08/09/2022 1430   FERRITIN 73 08/09/2022 1430   IRONPCTSAT 31 08/09/2022 1430   Lipid Panel  No results found for: "CHOL", "TRIG", "HDL", "CHOLHDL", "VLDL", "LDLCALC", "LDLDIRECT" Hepatic Function Panel  Component Value Date/Time   PROT 7.0 05/21/2022 1630   ALBUMIN 4.2 05/21/2022 1630   AST 14 05/21/2022 1630   ALT 18 05/21/2022 1630   ALKPHOS 73 05/21/2022 1630   BILITOT <0.2 05/21/2022 1630      Component Value Date/Time   TSH 1.530 08/09/2022 1430     Assessment and Plan:   Mid Intermittent Asthma:   She states that she has a history of mild intermittent asthma and that she only uses her inhaler if she exercises.  She states sometimes she uses a breathing treatment if she is sick with a respiratory infection.  Otherwise, she does well and does not need an inhaler or any other associated medicines.  Plan: She will use her  inhaler as needed for exercise.  Alpha thalassemia minor:  She has a history of alpha thalassemia minor and is only seeing her PCP for this at this time.  She has not necessitated a specialist as her hemoglobin and hematocrit levels have been stable.  Plan: She will follow-up with her PCP.  Family history of diabetes:  She states that her grandmother on her paternal side had diabetes.  She denies a history of insulin resistance, prediabetes or diabetes.  Plan: Will check an A1c and an insulin at her next visit.   Morbid Obesity: Current BMI 55.09    Obesity Treatment / Action Plan:  Patient will work on garnering support from family and friends to begin weight loss journey. Will work on eliminating or reducing the presence of highly palatable, calorie dense foods in the home. Will complete provided nutritional and psychosocial assessment questionnaire before the next appointment. Will be scheduled for indirect calorimetry to determine resting energy expenditure in a fasting state.  This will allow [place] to create a reduced calorie, high-protein meal plan to promote loss of fat mass while preserving muscle mass. Counseled on the health benefits of losing 5%-15% of total body weight. Was counseled on nutritional approaches to weight loss and benefits of reducing processed foods and consuming plant-based foods and high quality protein as part of nutritional weight management. Was counseled on pharmacotherapy and role as an adjunct in weight management.   Obesity Education Performed Today:  She was weighed on the bioimpedance scale and results were discussed and documented in the synopsis.  We discussed obesity as a disease and the importance of a more detailed evaluation of all the factors contributing to the disease.  We discussed the importance of long term lifestyle changes which include nutrition, exercise and behavioral modifications as well as the importance of customizing this to her  specific health and social needs.  We discussed the benefits of reaching a healthier weight to alleviate the symptoms of existing conditions and reduce the risks of the biomechanical, metabolic and psychological effects of obesity.  Discussed New Patient/Late Arrival, and Cancellation Policies. Patient voiced understanding and allowed to ask questions.   ARRABELLA WESTERMAN appears to be in the action stage of change and states they are ready to start intensive lifestyle modifications and behavioral modifications.  30 minutes was spent today on this visit including the above counseling, pre-visit chart review, and post-visit documentation.  Reviewed by clinician on day of visit: allergies, medications, problem list, medical history, surgical history, family history, social history, and previous encounter notes.    Warren Lindahl A. Lorretta HarpO.

## 2023-12-16 ENCOUNTER — Ambulatory Visit (INDEPENDENT_AMBULATORY_CARE_PROVIDER_SITE_OTHER): Admitting: Bariatrics

## 2023-12-16 ENCOUNTER — Encounter: Payer: Self-pay | Admitting: Bariatrics

## 2023-12-16 VITALS — BP 109/71 | HR 69 | Temp 98.1°F | Ht 64.0 in | Wt 326.0 lb

## 2023-12-16 DIAGNOSIS — R5383 Other fatigue: Secondary | ICD-10-CM

## 2023-12-16 DIAGNOSIS — E559 Vitamin D deficiency, unspecified: Secondary | ICD-10-CM | POA: Diagnosis not present

## 2023-12-16 DIAGNOSIS — Z1331 Encounter for screening for depression: Secondary | ICD-10-CM | POA: Diagnosis not present

## 2023-12-16 DIAGNOSIS — Z833 Family history of diabetes mellitus: Secondary | ICD-10-CM | POA: Diagnosis not present

## 2023-12-16 DIAGNOSIS — R0602 Shortness of breath: Secondary | ICD-10-CM

## 2023-12-16 DIAGNOSIS — Z0000 Encounter for general adult medical examination without abnormal findings: Secondary | ICD-10-CM

## 2023-12-16 DIAGNOSIS — J4520 Mild intermittent asthma, uncomplicated: Secondary | ICD-10-CM | POA: Diagnosis not present

## 2023-12-16 DIAGNOSIS — Z6841 Body Mass Index (BMI) 40.0 and over, adult: Secondary | ICD-10-CM

## 2023-12-16 DIAGNOSIS — E66813 Obesity, class 3: Secondary | ICD-10-CM | POA: Diagnosis not present

## 2023-12-16 NOTE — Progress Notes (Deleted)
.  aabn

## 2023-12-16 NOTE — Progress Notes (Signed)
 At a Glance:  Vitals Temp: 98.1 F (36.7 C) BP: 109/71 Pulse Rate: 69 SpO2: 100 %   Anthropometric Measurements Height: 5\' 4"  (1.626 m) Weight: (!) 326 lb (147.9 kg) BMI (Calculated): 55.93 Starting Weight: 326 lb Peak Weight: 329 lb Waist Measurement : 50.5 inches   Body Composition  Body Fat %: 54.5 % Fat Mass (lbs): 178.2 lbs Muscle Mass (lbs): 141.2 lbs Total Body Water (lbs): 107.6 lbs Visceral Fat Rating : 19   Other Clinical Data RMR: 2635 Fasting: no Labs: no Today's Visit #: 1 Starting Date: 12/16/23    EKG: Normal sinus rhythm, rate 60 bpm.  Indirect Calorimeter:   Resting Metabolic Rate ( RMR):  RMR (actual): 2635 Kcal RMR (calculated): 2243 Kcal The calculated basal metabolic rate is 4098 thus her basal metabolic rate is worse than expected.  Plan:   Indirect calorimeter completed, interpreted and reviewed with patient today and allowed to ask questions.  Discussed the implications for the chosen plan and exercise based on the RMR reading.  Will consider repeating the RMR in the future based on weight loss.    Chief Complaint:  Obesity   Subjective:  Jeanette Hernandez (MR# [id number]) is a 23 y.o. female who presents for evaluation and treatment of obesity and related comorbidities.   Jeanette Hernandez is currently in the action stage of change and ready to dedicate time achieving and maintaining a healthier weight. Jeanette Hernandez is interested in becoming our patient and working on intensive lifestyle modifications including (but not limited to) diet and exercise for weight loss.  Jeanette Hernandez has been struggling with her weight. She has been unsuccessful in either losing weight, maintaining weight loss, or reaching her healthy weight goal.  Jeanette Hernandez's habits were reviewed today and are as follows: Her family eats meals together, she thinks her family will eat healthier with her, she started gaining weight In the 5th to 6th grade, she has significant food cravings  issues, she snacks frequently in the evenings, she skips meals frequently, and she frequently eats larger portions than normal.   Current or previous pharmacotherapy: Is interested in pharmacotherapy She had tried Contrave in the past.   Response to medication: Ineffective so it was discontinued  Other Fatigue Jeanette Hernandez admits to daytime somnolence and admits to waking up still tired. Patient has a history of symptoms of daytime fatigue and morning fatigue. Jeanette Hernandez generally gets 7 or 8 hours of sleep per night, and states that she has nightime awakenings. Snoring is present. Apneic episodes are not present. Epworth Sleepiness Score is 4.        Shortness of Breath Ondine notes increasing shortness of breath with exercising and seems to be worsening over time with weight gain. She notes getting out of breath sooner with activity than she used to. This has not gotten worse recently. Jeanette Hernandez denies shortness of breath at rest or orthopnea.  Depression Screen Jeanette Hernandez's Food and Mood (modified PHQ-9) score was 7. 5-9 mild depression     12/16/2023    7:39 AM  Depression screen PHQ 2/9  Decreased Interest 1  Down, Depressed, Hopeless 1  PHQ - 2 Score 2  Altered sleeping 1  Tired, decreased energy 1  Change in appetite 1  Feeling bad or failure about yourself  1  Trouble concentrating 1  Moving slowly or fidgety/restless 0  Suicidal thoughts 0  PHQ-9 Score 7  Difficult doing work/chores Somewhat difficult     Assessment and Plan:   Other Fatigue Jeanette Hernandez does feel that  her weight is causing her energy to be lower than it should be. Fatigue may be related to obesity, depression or many other causes. Labs will be ordered, and in the meanwhile, Jeanette Hernandez will focus on self care including making healthy food choices, increasing physical activity and focusing on stress reduction.  Shortness of Breath Jeanette Hernandez does feel that she gets out of breath more easily that she used to when she exercises. Jeanette Hernandez's  shortness of breath appears to be obesity related and exercise induced. She has agreed to work on weight loss and gradually increase exercise to treat her exercise induced shortness of breath. Will continue to monitor closely.  Health Maintenance:   Obesity   Plan: Will do EKG, indirect calorimetry, and labs.     Vitamin D Deficiency She is at risk for vitamin D deficiency due to obesity.  She is on OTC vitamin D3 5000 IU daily.  Plan: Will check for vitamin D deficiency.   Mild, intermittent asthma:   She has a history of asthma and is usually symptom-free but can be triggered by an URI or allergies.   Plan: She will use her inhaler as needed.   Family history of diabetes:   She states that her paternal GM had diabetes type 2.   Plan: Will check insulin. Will keep her carbohydrates low.   Jeanette Hernandez had a positive depression screening. Depression is commonly associated with obesity and often results in emotional eating behaviors. We will monitor this closely and work on CBT to help improve the non-hunger eating patterns. Referral to Psychology may be required if no improvement is seen as she continues in our clinic.   Previous labs reviewed today. Date: 06/19/2023 A1c 5.5  Labs done today CMP, Lipids, Insulin, Vit D, and Thyroid Panel   Morbid Obesity: BMI (Calculated): 55.93   Jeanette Hernandez is currently in the action stage of change and her goal is to begin weight loss efforts. I recommend Jeanette Hernandez begin the structured treatment plan as follows:  She has agreed to Category 4 Plan  Exercise goals: All adults should avoid inactivity. Some activity is better than none, and adults who participate in any amount of physical activity, gain some health benefits.  Behavioral modification strategies:increasing lean protein intake, decreasing simple carbohydrates, increase H2O intake, decreasing eating out, keeping healthy foods in the home, and planning for success  She was informed of the  importance of frequent follow-up visits to maximize her success with intensive lifestyle modifications for her multiple health conditions. She was informed we would discuss her lab results at her next visit unless there is a critical issue that needs to be addressed sooner. Jeanette Hernandez agreed to keep her next visit at the agreed upon time to discuss these results.  Objective:  General: Cooperative, alert, well developed, in no acute distress. HEENT: Conjunctivae and lids unremarkable. Cardiovascular: Regular rhythm.  Lungs: Normal work of breathing. Neurologic: No focal deficits.   Lab Results  Component Value Date   CREATININE 0.81 05/21/2022   BUN 8 05/21/2022   NA 145 (H) 05/21/2022   K 4.4 05/21/2022   CL 109 (H) 05/21/2022   CO2 20 05/21/2022   Lab Results  Component Value Date   ALT 18 05/21/2022   AST 14 05/21/2022   ALKPHOS 73 05/21/2022   BILITOT <0.2 05/21/2022   Lab Results  Component Value Date   HGBA1C 5.5 06/19/2023   HGBA1C 5.5 05/21/2022   HGBA1C 5.5 07/13/2020   No results found for: "INSULIN" Lab Results  Component Value Date   TSH 1.530 08/09/2022   No results found for: "CHOL", "HDL", "LDLCALC", "LDLDIRECT", "TRIG", "CHOLHDL" Lab Results  Component Value Date   WBC 8.9 08/09/2022   HGB 11.6 08/09/2022   HCT 38.1 08/09/2022   MCV 75 (L) 08/09/2022   PLT 424 08/09/2022   Lab Results  Component Value Date   IRON 100 08/09/2022   TIBC 325 08/09/2022   FERRITIN 73 08/09/2022    Attestation Statements:  Applicable history such as the following:  allergies, medications, problem list, medical history, surgical history, family history, social history, and previous encounter notes reviewed by clinician on day of visit:  Time spent on visit including the items listed below was 40 minutes.  -preparing to see the patient (e.g., review of tests, history, previous notes) -obtaining and/or reviewing separately obtained history -counseling and educating the  patient/family/caregiver -documenting clinical information in the electronic or other health record -ordering medications, tests, or procedures -independently interpreting results and communicating results to the patient/ family/caregiver -referring and communicating with other health care professionals  -care coordination   This may have been prepared with the assistance of Engineer, civil (consulting).  Occasional wrong-word or sound-a-like substitutions may have occurred due to the inherent limitations of voice recognition software.    Corinna Capra, DO

## 2023-12-17 ENCOUNTER — Encounter: Payer: Self-pay | Admitting: Bariatrics

## 2023-12-17 DIAGNOSIS — E559 Vitamin D deficiency, unspecified: Secondary | ICD-10-CM | POA: Insufficient documentation

## 2023-12-17 LAB — VITAMIN D 25 HYDROXY (VIT D DEFICIENCY, FRACTURES): Vit D, 25-Hydroxy: 24.2 ng/mL — ABNORMAL LOW (ref 30.0–100.0)

## 2023-12-17 LAB — COMPREHENSIVE METABOLIC PANEL
ALT: 13 IU/L (ref 0–32)
AST: 14 IU/L (ref 0–40)
Albumin: 4.1 g/dL (ref 4.0–5.0)
Alkaline Phosphatase: 75 IU/L (ref 44–121)
BUN/Creatinine Ratio: 14 (ref 9–23)
BUN: 10 mg/dL (ref 6–20)
Bilirubin Total: 0.2 mg/dL (ref 0.0–1.2)
CO2: 21 mmol/L (ref 20–29)
Calcium: 9.4 mg/dL (ref 8.7–10.2)
Chloride: 103 mmol/L (ref 96–106)
Creatinine, Ser: 0.72 mg/dL (ref 0.57–1.00)
Globulin, Total: 2.9 g/dL (ref 1.5–4.5)
Glucose: 85 mg/dL (ref 70–99)
Potassium: 4.4 mmol/L (ref 3.5–5.2)
Sodium: 138 mmol/L (ref 134–144)
Total Protein: 7 g/dL (ref 6.0–8.5)
eGFR: 120 mL/min/{1.73_m2} (ref >59)

## 2023-12-17 LAB — TSH+T4F+T3FREE
Free T4: 1.09 ng/dL (ref 0.82–1.77)
T3, Free: 3.6 pg/mL (ref 2.0–4.4)
TSH: 1.84 u[IU]/mL (ref 0.450–4.500)

## 2023-12-17 LAB — LIPID PANEL WITH LDL/HDL RATIO
Cholesterol, Total: 145 mg/dL (ref 100–199)
HDL: 57 mg/dL (ref >39)
LDL Chol Calc (NIH): 75 mg/dL (ref 0–99)
LDL/HDL Ratio: 1.3 ratio (ref 0.0–3.2)
Triglycerides: 62 mg/dL (ref 0–149)
VLDL Cholesterol Cal: 13 mg/dL (ref 5–40)

## 2023-12-17 LAB — INSULIN, RANDOM: INSULIN: 14.5 u[IU]/mL (ref 2.6–24.9)

## 2023-12-20 ENCOUNTER — Telehealth: Payer: Self-pay

## 2023-12-20 DIAGNOSIS — F4323 Adjustment disorder with mixed anxiety and depressed mood: Secondary | ICD-10-CM

## 2023-12-20 NOTE — Telephone Encounter (Signed)
 Routing to PCP for review.

## 2023-12-20 NOTE — Telephone Encounter (Signed)
 Patient was called and informed that Ms. Margo Aye is currently on leave. She was asked if she wanted a referral placed and she states that she has to think about it and will call back.    Copied from CRM [id number]. Topic: General - Other >> Dec 20, 2023 10:22 AM Turkey B wrote: Reason for CRM: pt wants to schedule appt with Reginia Naas

## 2023-12-20 NOTE — Telephone Encounter (Signed)
 Patient called back and stated that would like a referral placed as long as her insurance would cover it. Please advise.

## 2023-12-23 NOTE — Telephone Encounter (Signed)
Patient was called and informed of referral being placed. 

## 2023-12-23 NOTE — Addendum Note (Signed)
 Addended by: Hoy Register on: 12/23/2023 04:49 PM   Modules accepted: Orders

## 2023-12-23 NOTE — Telephone Encounter (Signed)
 Referral has been placed.

## 2023-12-30 ENCOUNTER — Encounter: Payer: Self-pay | Admitting: Bariatrics

## 2023-12-30 ENCOUNTER — Telehealth: Payer: Self-pay | Admitting: Bariatrics

## 2023-12-30 ENCOUNTER — Other Ambulatory Visit (HOSPITAL_BASED_OUTPATIENT_CLINIC_OR_DEPARTMENT_OTHER): Payer: Self-pay

## 2023-12-30 ENCOUNTER — Ambulatory Visit (INDEPENDENT_AMBULATORY_CARE_PROVIDER_SITE_OTHER): Admitting: Bariatrics

## 2023-12-30 VITALS — BP 117/73 | HR 74 | Temp 98.0°F | Ht 64.0 in | Wt 326.0 lb

## 2023-12-30 DIAGNOSIS — R632 Polyphagia: Secondary | ICD-10-CM

## 2023-12-30 DIAGNOSIS — E559 Vitamin D deficiency, unspecified: Secondary | ICD-10-CM | POA: Diagnosis not present

## 2023-12-30 DIAGNOSIS — Z6841 Body Mass Index (BMI) 40.0 and over, adult: Secondary | ICD-10-CM

## 2023-12-30 DIAGNOSIS — E88819 Insulin resistance, unspecified: Secondary | ICD-10-CM | POA: Diagnosis not present

## 2023-12-30 MED ORDER — CHOLECALCIFEROL 1.25 MG (50000 UT) PO CAPS
50000.0000 [IU] | ORAL_CAPSULE | ORAL | 0 refills | Status: DC
  Filled 2023-12-30: qty 4, 28d supply, fill #0

## 2023-12-30 MED ORDER — WEGOVY 0.25 MG/0.5ML ~~LOC~~ SOAJ
0.2500 mg | SUBCUTANEOUS | 0 refills | Status: DC
  Filled 2023-12-30: qty 2, 28d supply, fill #0

## 2023-12-30 NOTE — Telephone Encounter (Signed)
 Pt is calling about wegovy. Her PA denied and insurance will not cover it. Please call patient she wanted to speak with a nurse.

## 2023-12-30 NOTE — Progress Notes (Addendum)
 First follow-up after initial visit.        WEIGHT SUMMARY AND BIOMETRICS  Weight Lost Since Last Visit: 0  Weight Gained Since Last Visit: 0   Vitals Temp: 98 F (36.7 C) BP: 117/73 Pulse Rate: 74 SpO2: 100 %   Anthropometric Measurements Height: 5\' 4"  (1.626 m) Weight: (!) 326 lb (147.9 kg) BMI (Calculated): 55.93 Weight at Last Visit: 326lb Weight Lost Since Last Visit: 0 Weight Gained Since Last Visit: 0 Starting Weight: 326lb Total Weight Loss (lbs): 0 lb (0 kg) Peak Weight: 326lb   Body Composition  Body Fat %: 55.2 % Fat Mass (lbs): 180 lbs Muscle Mass (lbs): 138.6 lbs Total Body Water (lbs): 108 lbs Visceral Fat Rating : 19   Other Clinical Data Fasting: no Labs: no Today's Visit #: 2 Starting Date: 12/16/23    OBESITY Jeanette Hernandez is here to discuss her progress with her obesity treatment plan along with follow-up of her obesity related diagnoses.    Nutrition Plan: the Category 4 plan - 100% adherence.  Current exercise: walking and weightlifting  Interim History:  Her weight remains the same.  Eating all of the food on the plan., Protein intake is as prescribed, Is not skipping meals, Not journaling consistently., Water intake is adequate., Reports polyphagia, and Reports excessive cravings.  Initial positives regarding the dietary plan: She states that she does not have any issues with the plan.  Initial challenges regarding the dietary plan: None.  Pharmacology:  She wants to start on a medication for weight loss.  Hunger is moderately controlled.  Cravings are moderately controlled. She wants to snack at night.  Assessment/Plan:   Insulin Resistance Judyth has had elevated fasting insulin readings. Goal is HgbA1c < 5.7, fasting insulin at l0 or less, and preferably at 5.  She reports polyphagia. Medication(s): none Lab  Results  Component Value Date   HGBA1C 5.5 06/19/2023   Lab Results  Component Value Date   INSULIN 14.5 12/16/2023    Plan Medication(s): Wegovy 0.25 mg SQ weekly Will work on the agreed upon plan. Will minimize refined carbohydrates ( sweets and starches), and focus more on complex carbohydrates.  Increase the micronutrients found in leafy greens, which include magnesium, polyphenols, and vitamin C which have been postulated to help with insulin sensitivity. Minimize "fast food" and cook more meals at home.  Increase fiber to 25 to 30 grams daily.  Information sheet on " Insulin Resistance and Prediabetes".    Vitamin D Deficiency Vitamin D is not at goal of 50.  Most recent vitamin D level was 24.2. She was vitamin D 2.  She is on vitamin D at home.  Lab Results  Component Value Date   VD25OH 24.2 (L) 12/16/2023    Plan: Will begin prescription vitamin D 3 50,000 IU weekly.   Polyphagia Marimar endorses excessive hunger.  Medication(s): none Appetite:  moderately controlled. Cravings are moderately controlled.   Plan: Medication(s): Wegovy 0.25 mg SQ weekly.  She denies any contraindications.  She is taking birth control pills and will continue with those and will use a backup method for the first month and if her dosage of Reginal Lutes is increased she will use the backup method again for a month and will continue as such until her dose stabilizes.  She is aware that she must use birth control at all times while taking this medication. Will increase water, protein and fiber to help assuage hunger.  Will minimize foods that have a high glucose index/load  to minimize reactive hypoglycemia.     Morbid Obesity: Current BMI BMI (Calculated): 55.93   Pharmacotherapy Plan Start  Wegovy 0.25 mg SQ weekly  Klaudia is currently in the action stage of change. As such, her goal is to continue with weight loss efforts.  She has agreed to the Category 4 plan.  Exercise goals: All adults  should avoid inactivity. Some physical activity is better than none, and adults who participate in any amount of physical activity gain some health benefits.  Behavioral modification strategies: increasing lean protein intake, decreasing simple carbohydrates , no meal skipping, meal planning , increase water intake, better snacking choices, planning for success, keep healthy foods in the home, weigh protein portions, and mindful eating.  Jason has agreed to follow-up with our clinic in 2 weeks.    Labs reviewed today from last visit (CMP, Lipids, HgbA1c, insulin, vitamin D, B 12, and thyroid panel).   Objective:   VITALS: Per patient if applicable, see vitals. GENERAL: Alert and in no acute distress. CARDIOPULMONARY: No increased WOB. Speaking in clear sentences.  PSYCH: Pleasant and cooperative. Speech normal rate and rhythm. Affect is appropriate. Insight and judgement are appropriate. Attention is focused, linear, and appropriate.  NEURO: Oriented as arrived to appointment on time with no prompting.   Attestation Statements:   This was prepared with the assistance of Engineer, civil (consulting).  Occasional wrong-word or sound-a-like substitutions may have occurred due to the inherent limitations of voice recognition software.   Corinna Capra, DO

## 2023-12-31 ENCOUNTER — Other Ambulatory Visit (HOSPITAL_BASED_OUTPATIENT_CLINIC_OR_DEPARTMENT_OTHER): Payer: Self-pay

## 2023-12-31 ENCOUNTER — Other Ambulatory Visit: Payer: Self-pay | Admitting: Bariatrics

## 2023-12-31 MED ORDER — QSYMIA 3.75-23 MG PO CP24
ORAL_CAPSULE | ORAL | 0 refills | Status: DC
  Filled 2023-12-31: qty 14, 14d supply, fill #0

## 2023-12-31 MED ORDER — QSYMIA 7.5-46 MG PO CP24
1.0000 | ORAL_CAPSULE | Freq: Every day | ORAL | 0 refills | Status: DC
  Filled 2023-12-31 – 2024-01-13 (×4): qty 30, 30d supply, fill #0

## 2023-12-31 NOTE — Telephone Encounter (Signed)
 Patient stated she would like to start on Qysmia. Will start her prior authorization. Notified her Dr. Manson Passey will send over prescription to pharmacy

## 2023-12-31 NOTE — Telephone Encounter (Signed)
 Sent PA for Qsymia via covermymeds

## 2024-01-01 ENCOUNTER — Other Ambulatory Visit (HOSPITAL_BASED_OUTPATIENT_CLINIC_OR_DEPARTMENT_OTHER): Payer: Self-pay

## 2024-01-01 NOTE — Telephone Encounter (Signed)
 Qsymia 3.75mg  approved through insurance from 01/01/2024 -01/15/2024 and  Qsymia 7.5mg  01/12/2024-04/11/24 Ref/CaseId [id number].

## 2024-01-13 ENCOUNTER — Other Ambulatory Visit (HOSPITAL_BASED_OUTPATIENT_CLINIC_OR_DEPARTMENT_OTHER): Payer: Self-pay

## 2024-01-13 ENCOUNTER — Other Ambulatory Visit: Payer: Self-pay

## 2024-01-15 ENCOUNTER — Other Ambulatory Visit (HOSPITAL_BASED_OUTPATIENT_CLINIC_OR_DEPARTMENT_OTHER): Payer: Self-pay

## 2024-01-15 ENCOUNTER — Encounter: Payer: Self-pay | Admitting: Bariatrics

## 2024-01-15 ENCOUNTER — Ambulatory Visit (INDEPENDENT_AMBULATORY_CARE_PROVIDER_SITE_OTHER): Admitting: Bariatrics

## 2024-01-15 VITALS — BP 123/76 | HR 68 | Temp 97.7°F | Ht 64.0 in | Wt 324.0 lb

## 2024-01-15 DIAGNOSIS — Z6841 Body Mass Index (BMI) 40.0 and over, adult: Secondary | ICD-10-CM | POA: Diagnosis not present

## 2024-01-15 DIAGNOSIS — E66813 Obesity, class 3: Secondary | ICD-10-CM

## 2024-01-15 DIAGNOSIS — R632 Polyphagia: Secondary | ICD-10-CM | POA: Diagnosis not present

## 2024-01-15 DIAGNOSIS — E559 Vitamin D deficiency, unspecified: Secondary | ICD-10-CM

## 2024-01-15 MED ORDER — VITAMIN D (ERGOCALCIFEROL) 1.25 MG (50000 UNIT) PO CAPS
50000.0000 [IU] | ORAL_CAPSULE | ORAL | 0 refills | Status: DC
  Filled 2024-01-15 – 2024-01-16 (×2): qty 5, 35d supply, fill #0

## 2024-01-15 NOTE — Progress Notes (Signed)
 WEIGHT SUMMARY AND BIOMETRICS  Weight Lost Since Last Visit: 2lb  Weight Gained Since Last Visit: 0   Vitals Temp: 97.7 F (36.5 C) BP: 123/76 Pulse Rate: 68 SpO2: 100 %   Anthropometric Measurements Height: 5\' 4"  (1.626 m) Weight: (!) 324 lb (147 kg) BMI (Calculated): 55.59 Weight at Last Visit: 326lb Weight Lost Since Last Visit: 2lb Weight Gained Since Last Visit: 0 Starting Weight: 326lb Total Weight Loss (lbs): 2 lb (0.907 kg) Peak Weight: 326lb   Body Composition  Body Fat %: 53.6 % Fat Mass (lbs): 173.6 lbs Muscle Mass (lbs): 142.8 lbs Total Body Water (lbs): 106 lbs Visceral Fat Rating : 19   Other Clinical Data Fasting: no Labs: no Today's Visit #: 3 Starting Date: 12/16/23    OBESITY Jeanette Hernandez is here to discuss her progress with her obesity treatment plan along with follow-up of her obesity related diagnoses.    Nutrition Plan: the Category 4 plan - 90% adherence.  Current exercise: walking and going to the gym.  Interim History:  She is down 2 lbs since her last visit.  Eating all of the food on the plan., Protein intake is less than prescribed., Is not skipping meals, and Water intake is adequate.   Pharmacotherapy: Jeanette Hernandez is on Qsymia 7.5/46 mg 1 capsule by mouth daily in am. She is finishing up the lowest dose and will begin Qsymia 7.5/46 mg dose soon.  Adverse side effects: None Hunger is moderately controlled.  Cravings are moderately controlled.  Assessment/Plan:   Jeanette Hernandez endorses excessive hunger.  Medication(s): Qsymia (lowest dose) Effects of medication:  moderately controlled. Cravings are moderately controlled.   Plan: Medication(s): Qsymia 7.5/46 mg 1 capsule by mouth daily in am Will increase water, protein and fiber to help assuage hunger.  Will minimize foods that have a high glucose index/load to  minimize reactive hypoglycemia.   Vitamin D Deficiency Vitamin D is not at goal of 50.  Most recent vitamin D level was 24.2. She is on  prescription ergocalciferol 50,000 IU weekly. Lab Results  Component Value Date   VD25OH 24.2 (L) 12/16/2023    Plan: Refill prescription vitamin D 50,000 IU weekly.    Morbid Obesity: Current BMI BMI (Calculated): 55.59   Pharmacotherapy Plan Continue  Qsymia 7.5/46 mg 1 capsule by mouth daily in am  Jeanette Hernandez is currently in the action stage of change. As such, her goal is to continue with weight loss efforts.  She has agreed to the Category 4 plan.  Exercise goals: For substantial health benefits, adults should do at least 150 minutes (2 hours and 30 minutes) a week of moderate-intensity, or 75 minutes (1 hour and 15 minutes) a week of vigorous-intensity aerobic physical activity, or an equivalent combination of moderate- and vigorous-intensity aerobic activity. Aerobic activity should be performed in episodes of at least 10 minutes, and preferably, it  should be spread throughout the week.  Behavioral modification strategies: increasing lean protein intake, no meal skipping, decrease eating out, meal planning , increase water intake, better snacking choices, planning for success, avoiding temptations, and keep healthy foods in the home.  Jeanette Hernandez has agreed to follow-up with our clinic in 3 weeks.    Objective:   VITALS: Per patient if applicable, see vitals. GENERAL: Alert and in no acute distress. CARDIOPULMONARY: No increased WOB. Speaking in clear sentences.  PSYCH: Pleasant and cooperative. Speech normal rate and rhythm. Affect is appropriate. Insight and judgement are appropriate. Attention is focused, linear, and appropriate.  NEURO: Oriented as arrived to appointment on time with no prompting.   Attestation Statements:    This was prepared with the assistance of Engineer, civil (consulting).  Occasional wrong-word or sound-a-like substitutions  may have occurred due to the inherent limitations of voice recognition   Jeanette Capra, DO

## 2024-01-16 ENCOUNTER — Other Ambulatory Visit (HOSPITAL_BASED_OUTPATIENT_CLINIC_OR_DEPARTMENT_OTHER): Payer: Self-pay

## 2024-02-06 ENCOUNTER — Other Ambulatory Visit (HOSPITAL_BASED_OUTPATIENT_CLINIC_OR_DEPARTMENT_OTHER): Payer: Self-pay

## 2024-02-06 ENCOUNTER — Encounter: Payer: Self-pay | Admitting: Bariatrics

## 2024-02-06 ENCOUNTER — Ambulatory Visit: Admitting: Bariatrics

## 2024-02-06 VITALS — BP 111/62 | HR 77 | Temp 98.1°F | Ht 64.0 in | Wt 323.0 lb

## 2024-02-06 DIAGNOSIS — E559 Vitamin D deficiency, unspecified: Secondary | ICD-10-CM | POA: Diagnosis not present

## 2024-02-06 DIAGNOSIS — Z6841 Body Mass Index (BMI) 40.0 and over, adult: Secondary | ICD-10-CM | POA: Diagnosis not present

## 2024-02-06 DIAGNOSIS — R632 Polyphagia: Secondary | ICD-10-CM

## 2024-02-06 MED ORDER — PHENTERMINE-TOPIRAMATE ER 15-92 MG PO CP24
ORAL_CAPSULE | ORAL | 0 refills | Status: DC
  Filled 2024-02-06: qty 30, 30d supply, fill #0

## 2024-02-06 MED ORDER — VITAMIN D (ERGOCALCIFEROL) 1.25 MG (50000 UNIT) PO CAPS
50000.0000 [IU] | ORAL_CAPSULE | ORAL | 0 refills | Status: DC
  Filled 2024-02-06 – 2024-03-09 (×2): qty 5, 35d supply, fill #0

## 2024-02-06 MED ORDER — QSYMIA 11.25-69 MG PO CP24
ORAL_CAPSULE | ORAL | 0 refills | Status: DC
  Filled 2024-02-06: qty 14, 14d supply, fill #0

## 2024-02-06 NOTE — Progress Notes (Signed)
 WEIGHT SUMMARY AND BIOMETRICS  Weight Lost Since Last Visit: 1lb  Weight Gained Since Last Visit: 0   Vitals Temp: 98.1 F (36.7 C) BP: 111/62 Pulse Rate: 77 SpO2: 98 %   Anthropometric Measurements Height: 5\' 4"  (1.626 m) Weight: (!) 323 lb (146.5 kg) BMI (Calculated): 55.42 Weight at Last Visit: 324lb Weight Lost Since Last Visit: 1lb Weight Gained Since Last Visit: 0 Starting Weight: 326lb Total Weight Loss (lbs): 3 lb (1.361 kg) Peak Weight: 326lb   Body Composition  Body Fat %: 55.5 % Fat Mass (lbs): 179.2 lbs Muscle Mass (lbs): 136.6 lbs Total Body Water (lbs): 111 lbs Visceral Fat Rating : 19   Other Clinical Data Fasting: no Labs: no Today's Visit #: 4 Starting Date: 12/16/23    OBESITY Alisyn is here to discuss her progress with her obesity treatment plan along with follow-up of her obesity related diagnoses.    Nutrition Plan: the Category 4 plan - 90% adherence.  Current exercise: walking  Interim History:  She is down 1 lb since her last visit.  Eating all of the food on the plan., Protein intake is as prescribed, Is not skipping meals, and Water intake is adequate.   Pharmacotherapy: Denille is on Qsymia  7.5/46 mg 1 capsule by mouth daily in am Adverse side effects: None Hunger is moderately controlled. Her hunger is definitely improved, but still has some increased hunger.  Cravings are moderately controlled.  Assessment/Plan:   Vitamin D  Deficiency Vitamin D  is not at goal of 50.  Most recent vitamin D  level was 24.2. She is on  prescription ergocalciferol  50,000 IU weekly. Lab Results  Component Value Date   VD25OH 24.2 (L) 12/16/2023    Plan: Refill prescription vitamin D  50,000 IU weekly.    Polyphagia Amarielle endorses excessive hunger.  Medication(s): Qsymia  7.5-46 mg daily.  Effects of medication:  moderately  controlled. Cravings are moderately controlled.  She is using birth control pills on a regular basis and was informed that she will need to use condoms for the next month with the dosage change of her Qsymia  she voiced understanding.  I informed her that Qsymia  can be teratogenic and that she always needs to use a reliable form of birth control  Plan: Medication(s): Qsymia  11.25/69 mg 1 capsule by mouth daily in am and Qsymia  15/92 mg 1 capsule by mouth daily in am Will increase water, protein and fiber to help assuage hunger.  Will minimize foods that have a high glucose index/load to minimize reactive hypoglycemia.    Morbid Obesity: Current BMI BMI (Calculated): 55.42   Pharmacotherapy Plan Continue and increase dose  Qsymia  11.25/69 mg 1 capsule by mouth daily in am and Qsymia  15/92 mg 1 capsule by mouth daily in am  Lexa is currently in the action stage of change. As such, her goal is to continue with weight loss  efforts.  She has agreed to the Category 4 plan.  Exercise goals: For substantial health benefits, adults should do at least 150 minutes (2 hours and 30 minutes) a week of moderate-intensity, or 75 minutes (1 hour and 15 minutes) a week of vigorous-intensity aerobic physical activity, or an equivalent combination of moderate- and vigorous-intensity aerobic activity. Aerobic activity should be performed in episodes of at least 10 minutes, and preferably, it should be spread throughout the week.  Behavioral modification strategies: increasing lean protein intake, decreasing simple carbohydrates , no meal skipping, decrease eating out, meal planning , increase water intake, better snacking choices, and planning for success.  Jaciana has agreed to follow-up with our clinic in 6 weeks. She has to be out of town and will require more time between her next visit.   No orders of the defined types were placed in this encounter.   Medications Discontinued During This Encounter   Medication Reason   Semaglutide -Weight Management (WEGOVY ) 0.25 MG/0.5ML SOAJ Change in therapy        Objective:   VITALS: Per patient if applicable, see vitals. GENERAL: Alert and in no acute distress. CARDIOPULMONARY: No increased WOB. Speaking in clear sentences.  PSYCH: Pleasant and cooperative. Speech normal rate and rhythm. Affect is appropriate. Insight and judgement are appropriate. Attention is focused, linear, and appropriate.  NEURO: Oriented as arrived to appointment on time with no prompting.   Attestation Statements:   Kirk Peper, DO

## 2024-02-11 ENCOUNTER — Telehealth (INDEPENDENT_AMBULATORY_CARE_PROVIDER_SITE_OTHER): Payer: Self-pay

## 2024-02-11 ENCOUNTER — Other Ambulatory Visit: Payer: Self-pay | Admitting: Bariatrics

## 2024-02-11 ENCOUNTER — Other Ambulatory Visit: Payer: Self-pay

## 2024-02-11 ENCOUNTER — Other Ambulatory Visit (HOSPITAL_BASED_OUTPATIENT_CLINIC_OR_DEPARTMENT_OTHER): Payer: Self-pay

## 2024-02-11 ENCOUNTER — Telehealth (INDEPENDENT_AMBULATORY_CARE_PROVIDER_SITE_OTHER): Payer: Self-pay | Admitting: Bariatrics

## 2024-02-11 ENCOUNTER — Telehealth: Payer: Self-pay | Admitting: Bariatrics

## 2024-02-11 MED ORDER — QSYMIA 11.25-69 MG PO CP24
ORAL_CAPSULE | ORAL | 0 refills | Status: DC
  Filled 2024-02-11: qty 30, 30d supply, fill #0

## 2024-02-11 NOTE — Telephone Encounter (Signed)
 Good morning,  She would like clarification on her qsymia  15.92 mg that was denied. She said she received a message but would like a phone call.   Thanks!

## 2024-02-11 NOTE — Telephone Encounter (Signed)
 I called the patient and spoke to her about her PA for her medication. I explained why one dose was approved and one was denied. I told her we could submit a new one for the denied dose, when its time for her to titrate up to that dose.

## 2024-02-11 NOTE — Telephone Encounter (Signed)
 Duplicate message, already spoke to patient

## 2024-02-11 NOTE — Telephone Encounter (Signed)
 Spoke to patient notified her insurance approved 11.25mg  of Qsymia  and denied Qsymia  15 mg due to her needing to be on the medication for at least 3 months and have achieved of maintained at least a 5 percent weight loss on body weight. Also notified patient that Dr. Bevin Bucks can send in a 30 day prescription on 11.25mg . Patient verbalized understanding.

## 2024-02-11 NOTE — Telephone Encounter (Signed)
 Pt is calling because there is an issue with her Qsymia , She has called the pharmacy and they are giving her the run around. Please called at [phone]. The 14 day supply she has to pay over $100 vs the 30 day its cheaper with the highest dosage.

## 2024-02-11 NOTE — Telephone Encounter (Signed)
 PA for Qsymia  11.25-69MG  has been approved. PA is now complete.     Outcome Approved on May 5 by [employer] 2017 The request has been approved. The authorization is effective from 02/10/2024 to 02/24/2024, as long as the member is enrolled in their current health plan. The request was reviewed and approved by a licensed clinical pharmacist. This request was approved with a quantity limit of 1 capsule per day. A written notification letter will follow with additional details. Effective Date: 02/10/2024 Authorization Expiration Date: 02/24/2024

## 2024-02-11 NOTE — Telephone Encounter (Signed)
 PA Appeal for Qsymia  15-92MG  has been denied. PA is now complete.

## 2024-03-09 ENCOUNTER — Other Ambulatory Visit (HOSPITAL_BASED_OUTPATIENT_CLINIC_OR_DEPARTMENT_OTHER): Payer: Self-pay

## 2024-03-23 ENCOUNTER — Encounter: Payer: Self-pay | Admitting: Bariatrics

## 2024-03-23 ENCOUNTER — Encounter (HOSPITAL_BASED_OUTPATIENT_CLINIC_OR_DEPARTMENT_OTHER): Payer: Self-pay

## 2024-03-23 ENCOUNTER — Telehealth: Payer: Self-pay | Admitting: Bariatrics

## 2024-03-23 ENCOUNTER — Ambulatory Visit: Admitting: Bariatrics

## 2024-03-23 ENCOUNTER — Other Ambulatory Visit: Payer: Self-pay

## 2024-03-23 ENCOUNTER — Other Ambulatory Visit (HOSPITAL_BASED_OUTPATIENT_CLINIC_OR_DEPARTMENT_OTHER): Payer: Self-pay

## 2024-03-23 VITALS — BP 105/67 | HR 78 | Temp 98.2°F | Ht 64.0 in | Wt 319.0 lb

## 2024-03-23 DIAGNOSIS — E559 Vitamin D deficiency, unspecified: Secondary | ICD-10-CM | POA: Diagnosis not present

## 2024-03-23 DIAGNOSIS — R632 Polyphagia: Secondary | ICD-10-CM

## 2024-03-23 DIAGNOSIS — Z6841 Body Mass Index (BMI) 40.0 and over, adult: Secondary | ICD-10-CM

## 2024-03-23 MED ORDER — WEGOVY 0.25 MG/0.5ML ~~LOC~~ SOAJ
0.2500 mg | SUBCUTANEOUS | 0 refills | Status: DC
  Filled 2024-03-23: qty 2, 28d supply, fill #0

## 2024-03-23 MED ORDER — PHENTERMINE-TOPIRAMATE ER 11.25-69 MG PO CP24
1.0000 | ORAL_CAPSULE | Freq: Every day | ORAL | 0 refills | Status: DC
  Filled 2024-03-23 – 2024-04-06 (×3): qty 30, 30d supply, fill #0

## 2024-03-23 MED ORDER — VITAMIN D (ERGOCALCIFEROL) 1.25 MG (50000 UNIT) PO CAPS
50000.0000 [IU] | ORAL_CAPSULE | ORAL | 0 refills | Status: DC
  Filled 2024-03-23: qty 4, 28d supply, fill #0

## 2024-03-23 NOTE — Telephone Encounter (Signed)
 Dr.Brown expressed to patient to speak with CMA about a medication (Wegovy ). Please give her a call when you can. Thanks!

## 2024-03-23 NOTE — Progress Notes (Signed)
 WEIGHT SUMMARY AND BIOMETRICS  Weight Lost Since Last Visit: 4lb  Weight Gained Since Last Visit: 0   Vitals Temp: 98.2 F (36.8 C) BP: 105/67 Pulse Rate: 78 SpO2: 98 %   Anthropometric Measurements Height: 5' 4 (1.626 m) Weight: (!) 319 lb (144.7 kg) BMI (Calculated): 54.73 Weight at Last Visit: 323lb Weight Lost Since Last Visit: 4lb Weight Gained Since Last Visit: 0 Starting Weight: 326lb Total Weight Loss (lbs): 7 lb (3.175 kg) Peak Weight: 326lb   Body Composition  Body Fat %: 55.9 % Fat Mass (lbs): 178.4 lbs Muscle Mass (lbs): 133.6 lbs Visceral Fat Rating : 19   Other Clinical Data Fasting: no Labs: no Today's Visit #: 5 Starting Date: 12/16/23    OBESITY Neyda is here to discuss her progress with her obesity treatment plan along with follow-up of her obesity related diagnoses.    Nutrition Plan: the Category 4 plan - 90% adherence.  Current exercise: walking  Interim History:  She is down 4 lbs since her last visit.  Eating all of the food on the plan., Is not skipping meals, and Water intake is adequate.   Pharmacotherapy: Jordayn is on Qsymia  11.25/69 mg 1 capsule by mouth daily in am Adverse side effects: None Hunger is moderately controlled.  Cravings are moderately controlled.  Assessment/Plan:   Galina Haddox endorses excessive hunger.  Medication(s): Qsymia  Effects of medication:  moderately controlled. Cravings are moderately controlled.   Plan: Medication(s): Qsymia  11.25/69 mg 1 capsule by mouth daily in am.  I had prescribed Wegovy  for her originally but her insurance would not pay for the medication.  She now has new insurance and would like to see if her insurance will pay for a GLP-1.  Aileene denies personal or family history of thyroid  cancer, history of pancreatitis, or current cholelithiasis. Gennette was  informed of the most common side effects (nausea, constipation, diarrhea). She was given GLP-1 information sheet.  Patient informed to watch for possible symptoms, such as a lump or swelling in the neck, hoarseness, trouble swallowing, or shortness of breath. If you have any of these symptoms, tell your healthcare provide.   She is aware that she needs to use reliable birth control at all times while taking a GLP-1. Informed that she is to use a back-up method for the first month and then again for 1 month if the dosage is increased.   She has been placed on a 500 calorie deficit diet.  She has been advised to exercise at least 150 minutes per week, both cardio and resistance.   Will increase water, protein and fiber to help assuage hunger.  Will minimize foods that have a high glucose index/load to minimize reactive hypoglycemia.   Vitamin D  Deficiency Vitamin D  is not at goal of 50.  Most recent vitamin D  level was 24.2. She is on  prescription ergocalciferol   50,000 IU weekly. Lab Results  Component Value Date   VD25OH 24.2 (L) 12/16/2023    Plan: Refill prescription vitamin D  50,000 IU weekly.    Morbid Obesity: Current BMI BMI (Calculated): 54.73   Pharmacotherapy Plan Start  Wegovy  0.25 mg SQ weekly. I will stop the Qsymia  if her Wegovy  is approved.   Sheily is currently in the action stage of change. As such, her goal is to continue with weight loss efforts.  She has agreed to the Category 4 plan.  Exercise goals: All adults should avoid inactivity. Some physical activity is better than none, and adults who participate in any amount of physical activity gain some health benefits.  Behavioral modification strategies: increasing lean protein intake, no meal skipping, meal planning , increase water intake, better snacking choices, planning for success, increasing vegetables, avoiding temptations, weigh protein portions, and mindful eating.  Chalet has agreed to follow-up with our  clinic in 2 weeks.    Objective:   VITALS: Per patient if applicable, see vitals. GENERAL: Alert and in no acute distress. CARDIOPULMONARY: No increased WOB. Speaking in clear sentences.  PSYCH: Pleasant and cooperative. Speech normal rate and rhythm. Affect is appropriate. Insight and judgement are appropriate. Attention is focused, linear, and appropriate.  NEURO: Oriented as arrived to appointment on time with no prompting.   Attestation Statements:   This was prepared with the assistance of Engineer, civil (consulting).  Occasional wrong-word or sound-a-like substitutions may have occurred due to the inherent limitations of voice recognition   Kirk Peper, DO

## 2024-03-23 NOTE — Telephone Encounter (Signed)
 Spoke to patient and she stated she would like to start on Wegovy  out of pocket and wanted to let Dr. Bevin Bucks know. Notified patient I will inform Dr. Bevin Bucks. Patient verbalized understanding

## 2024-03-26 ENCOUNTER — Other Ambulatory Visit (HOSPITAL_BASED_OUTPATIENT_CLINIC_OR_DEPARTMENT_OTHER): Payer: Self-pay

## 2024-04-06 ENCOUNTER — Other Ambulatory Visit (HOSPITAL_BASED_OUTPATIENT_CLINIC_OR_DEPARTMENT_OTHER): Payer: Self-pay

## 2024-04-09 ENCOUNTER — Other Ambulatory Visit (HOSPITAL_BASED_OUTPATIENT_CLINIC_OR_DEPARTMENT_OTHER): Payer: Self-pay

## 2024-04-17 ENCOUNTER — Other Ambulatory Visit (HOSPITAL_BASED_OUTPATIENT_CLINIC_OR_DEPARTMENT_OTHER): Payer: Self-pay

## 2024-04-20 ENCOUNTER — Ambulatory Visit: Admitting: Bariatrics

## 2024-04-20 ENCOUNTER — Encounter: Payer: Self-pay | Admitting: Bariatrics

## 2024-04-20 ENCOUNTER — Other Ambulatory Visit (HOSPITAL_BASED_OUTPATIENT_CLINIC_OR_DEPARTMENT_OTHER): Payer: Self-pay

## 2024-04-20 VITALS — BP 100/62 | HR 67 | Temp 98.5°F | Ht 64.0 in | Wt 312.0 lb

## 2024-04-20 DIAGNOSIS — E88819 Insulin resistance, unspecified: Secondary | ICD-10-CM

## 2024-04-20 DIAGNOSIS — Z6841 Body Mass Index (BMI) 40.0 and over, adult: Secondary | ICD-10-CM

## 2024-04-20 DIAGNOSIS — R110 Nausea: Secondary | ICD-10-CM

## 2024-04-20 DIAGNOSIS — R632 Polyphagia: Secondary | ICD-10-CM | POA: Diagnosis not present

## 2024-04-20 MED ORDER — VITAMIN D (ERGOCALCIFEROL) 1.25 MG (50000 UNIT) PO CAPS
50000.0000 [IU] | ORAL_CAPSULE | ORAL | 0 refills | Status: DC
  Filled 2024-04-20: qty 4, 28d supply, fill #0

## 2024-04-20 MED ORDER — WEGOVY 0.25 MG/0.5ML ~~LOC~~ SOAJ
0.2500 mg | SUBCUTANEOUS | 0 refills | Status: DC
  Filled 2024-04-20: qty 2, 28d supply, fill #0

## 2024-04-20 MED ORDER — ONDANSETRON HCL 4 MG PO TABS
4.0000 mg | ORAL_TABLET | Freq: Three times a day (TID) | ORAL | 0 refills | Status: AC | PRN
  Filled 2024-04-20: qty 30, 10d supply, fill #0

## 2024-04-20 NOTE — Progress Notes (Signed)
 WEIGHT SUMMARY AND BIOMETRICS  Weight Lost Since Last Visit: 7lb  Weight Gained Since Last Visit: 0   Vitals Temp: 98.5 F (36.9 C) BP: 100/62 Pulse Rate: 67 SpO2: 100 %   Anthropometric Measurements Height: 5' 4 (1.626 m) Weight: (!) 312 lb (141.5 kg) BMI (Calculated): 53.53 Weight at Last Visit: 319lb Weight Lost Since Last Visit: 7lb Weight Gained Since Last Visit: 0 Starting Weight: 326lb Total Weight Loss (lbs): 14 lb (6.35 kg) Peak Weight: 326lb   Body Composition  Body Fat %: 54 % Fat Mass (lbs): 168.6 lbs Muscle Mass (lbs): 136.6 lbs Total Body Water (lbs): 106.8 lbs Visceral Fat Rating : 18   Other Clinical Data Fasting: no Labs: no Today's Visit #: 6 Starting Date: 12/16/23    OBESITY Alta is here to discuss her progress with her obesity treatment plan along with follow-up of her obesity related diagnoses.    Nutrition Plan: the Category 4 plan - 90% adherence.  Current exercise: walking and weightlifting  Interim History:  She is down 7 pounds since her last visit.  She did start her Wegovy  and states that she has had some nausea. She did her labs last week and reviewed with her today.  Eating all of the food on the plan., Protein intake is as prescribed, Is not skipping meals, Water intake is adequate., and Denies polyphagia   Pharmacotherapy: Anniah is on Wegovy  0.25 mg SQ weekly Adverse side effects: Nausea Hunger is moderately controlled.  Cravings are moderately controlled.  Assessment/Plan:   Alexah Kivett endorses excessive hunger.  Medication(s): Wegovy  2.5 mg Effects of medication:  moderately controlled. Cravings are moderately controlled.   Plan: Medication(s): Wegovy  0.25 mg SQ weekly Will increase water, protein and fiber to help assuage hunger.  Will minimize foods that have a high glucose index/load to  minimize reactive hypoglycemia.   Insulin  Resistance Andreia has had elevated fasting insulin  readings. Goal is HgbA1c < 5.7, fasting insulin  at l0 or less, and preferably at 5.  She   denies excessive polyphagia and states that the Wegovy  is helpful with appetite. Medication(s): Wegovy  Lab Results  Component Value Date   HGBA1C 5.5 06/19/2023   Lab Results  Component Value Date   INSULIN  14.5 12/16/2023    Plan Medication(s): Wegovy  0.25 mg SQ weekly  Will minimize refined carbohydrates ( sweets and starches), and focus more on complex carbohydrates.  Increase the micronutrients found in leafy greens, which include magnesium, polyphenols, and vitamin C which have been postulated to help with insulin  sensitivity. Minimize fast food and cook more meals at home.  Increase fiber to 25 to 30 grams daily.   Nausea:   She had some nausea with Wegovy , but tolerating.  She has been using ginger chews.  Plan:  Rx for Zofran  4 mg every 8 hours as needed #30 with no refills.   Morbid  Obesity: Current BMI BMI (Calculated): 53.53   Pharmacotherapy Plan Continue and refill  Wegovy  0.25 mg SQ weekly  Tesha is currently in the action stage of change. As such, her goal is to continue with weight loss efforts.  She has agreed to the Category 4 plan with low to no refined carbs and increased protein.  Exercise goals: All adults should avoid inactivity. Some physical activity is better than none, and adults who participate in any amount of physical activity gain some health benefits.  Behavioral modification strategies: increasing lean protein intake, no meal skipping, meal planning , increase water intake, better snacking choices, planning for success, increasing vegetables, increasing fiber rich foods, get rid of junk food in the home, avoiding temptations, keep healthy foods in the home, weigh protein portions, and mindful eating.  Steffany has agreed to follow-up with our clinic in 2 weeks.       Objective:   VITALS: Per patient if applicable, see vitals. GENERAL: Alert and in no acute distress. CARDIOPULMONARY: No increased WOB. Speaking in clear sentences.  PSYCH: Pleasant and cooperative. Speech normal rate and rhythm. Affect is appropriate. Insight and judgement are appropriate. Attention is focused, linear, and appropriate.  NEURO: Oriented as arrived to appointment on time with no prompting.   Attestation Statements:   This was prepared with the assistance of Engineer, civil (consulting).  Occasional wrong-word or sound-a-like substitutions may have occurred due to the inherent limitations of voice recognition   Clayborne Daring, DO

## 2024-04-24 ENCOUNTER — Other Ambulatory Visit (HOSPITAL_BASED_OUTPATIENT_CLINIC_OR_DEPARTMENT_OTHER): Payer: Self-pay

## 2024-05-08 ENCOUNTER — Other Ambulatory Visit (HOSPITAL_BASED_OUTPATIENT_CLINIC_OR_DEPARTMENT_OTHER): Payer: Self-pay

## 2024-05-11 ENCOUNTER — Other Ambulatory Visit (HOSPITAL_BASED_OUTPATIENT_CLINIC_OR_DEPARTMENT_OTHER): Payer: Self-pay

## 2024-05-11 ENCOUNTER — Encounter: Payer: Self-pay | Admitting: Bariatrics

## 2024-05-11 ENCOUNTER — Ambulatory Visit: Admitting: Bariatrics

## 2024-05-11 VITALS — BP 128/79 | HR 64 | Temp 97.9°F | Ht 64.0 in | Wt 310.0 lb

## 2024-05-11 DIAGNOSIS — N915 Oligomenorrhea, unspecified: Secondary | ICD-10-CM | POA: Diagnosis not present

## 2024-05-11 DIAGNOSIS — Z6841 Body Mass Index (BMI) 40.0 and over, adult: Secondary | ICD-10-CM

## 2024-05-11 DIAGNOSIS — E66813 Obesity, class 3: Secondary | ICD-10-CM

## 2024-05-11 DIAGNOSIS — R632 Polyphagia: Secondary | ICD-10-CM | POA: Diagnosis not present

## 2024-05-11 DIAGNOSIS — E559 Vitamin D deficiency, unspecified: Secondary | ICD-10-CM | POA: Diagnosis not present

## 2024-05-11 LAB — POCT URINE PREGNANCY: Preg Test, Ur: NEGATIVE

## 2024-05-11 MED ORDER — WEGOVY 0.5 MG/0.5ML ~~LOC~~ SOAJ
0.5000 mg | SUBCUTANEOUS | 0 refills | Status: DC
  Filled 2024-05-11: qty 2, 28d supply, fill #0

## 2024-05-11 NOTE — Progress Notes (Signed)
 WEIGHT SUMMARY AND BIOMETRICS  Weight Lost Since Last Visit: 2lb  Weight Gained Since Last Visit: 0lb   Vitals Temp: 97.9 F (36.6 C) BP: 128/79 Pulse Rate: 64 SpO2: 100 %   Anthropometric Measurements Height: 5' 4 (1.626 m) Weight: (!) 310 lb (140.6 kg) BMI (Calculated): 53.19 Weight at Last Visit: 312lb Weight Lost Since Last Visit: 2lb Weight Gained Since Last Visit: 0lb Starting Weight: 326lb Total Weight Loss (lbs): 16 lb (7.258 kg)   Body Composition  Body Fat %: 53.1 % Fat Mass (lbs): 164.6 lbs Muscle Mass (lbs): 138 lbs Total Body Water (lbs): 105.2 lbs Visceral Fat Rating : 17   Other Clinical Data Fasting: Yes Labs: No Today's Visit #: 7 Starting Date: 12/16/23    OBESITY Jeanette Hernandez is here to discuss her progress with her obesity treatment plan along with follow-up of her obesity related diagnoses.    Nutrition Plan: the Category 4 plan - 85% adherence.  Current exercise: walking  Interim History:  She is down 2 lbs since her last visit. She is doing well on the Wegovy  with minimal side effects. Her cycle was slightly shorter than normal.  Eating all of the food on the plan., Protein intake is as prescribed, Is not skipping meals, and Water intake is adequate.   Pharmacotherapy: Rodnesha is on Wegovy  0.25 mg SQ weekly Adverse side effects: Nausea (mild) She has Zofran  if needed.  Hunger is moderately controlled.  Cravings are moderately controlled.  Assessment/Plan:   Jeanette Hernandez endorses excessive hunger.  Medication(s): Wegovy  0.25 mg Effects of medication (appetite):  moderately controlled. Cravings are moderately controlled.   Plan: Medication(s): Wegovy  0.50 mg SQ weekly Will increase water, protein and fiber to help assuage hunger.  Will minimize foods that have a high glucose index/load to minimize reactive  hypoglycemia.   Vitamin D  Insuffiencey:  Vitamin D  is not at goal of 50.  Most recent vitamin D  level was 24.2. She is on  prescription ergocalciferol  50,000 IU weekly. Lab Results  Component Value Date   VD25OH 24.2 (L) 12/16/2023    Plan: Continue prescription vitamin D  50,000 IU weekly.   Light menstrual flow:   Her last menstrual period was on 05/05/24, and was lighter than normal.  She is taking birth control pills on a regular basis.  She has been on Wegovy  over the last 3 to 4 weeks and doing well.  Plan:  I did a point-of-care pregnancy test and it was negative. I am going to increase her Wegovy  to 0.5 mg into the skin weekly.  We discussed that she will need to use backup for 1 month with the increased dose along with her birth control pills continuously. She will continue to use her Zofran  as needed.   Morbid Obesity: Current BMI BMI (Calculated): 53.19   Pharmacotherapy Plan Continue and increase dose  Wegovy  0.50 mg  SQ weekly  Jeanette Hernandez is currently in the action stage of change. As such, her goal is to continue with weight loss efforts.  She has agreed to the Category 4 plan.  Exercise goals: For substantial health benefits, adults should do at least 150 minutes (2 hours and 30 minutes) a week of moderate-intensity, or 75 minutes (1 hour and 15 minutes) a week of vigorous-intensity aerobic physical activity, or an equivalent combination of moderate- and vigorous-intensity aerobic activity. Aerobic activity should be performed in episodes of at least 10 minutes, and preferably, it should be spread throughout the week.  Behavioral modification strategies: no meal skipping, meal planning , increase water intake, better snacking choices, planning for success, increasing vegetables, decrease snacking , avoiding temptations, keep healthy foods in the home, weigh protein portions, and mindful eating.  Jeanette Hernandez has agreed to follow-up with our clinic in 2 weeks.     Objective:    VITALS: Per patient if applicable, see vitals. GENERAL: Alert and in no acute distress. CARDIOPULMONARY: No increased WOB. Speaking in clear sentences.  PSYCH: Pleasant and cooperative. Speech normal rate and rhythm. Affect is appropriate. Insight and judgement are appropriate. Attention is focused, linear, and appropriate.  NEURO: Oriented as arrived to appointment on time with no prompting.   Attestation Statements:   This was prepared with the assistance of Engineer, civil (consulting).  Occasional wrong-word or sound-a-like substitutions may have occurred due to the inherent limitations of voice recognition   Clayborne Daring, DO

## 2024-05-15 ENCOUNTER — Other Ambulatory Visit (HOSPITAL_BASED_OUTPATIENT_CLINIC_OR_DEPARTMENT_OTHER): Payer: Self-pay

## 2024-05-16 ENCOUNTER — Other Ambulatory Visit (HOSPITAL_BASED_OUTPATIENT_CLINIC_OR_DEPARTMENT_OTHER): Payer: Self-pay

## 2024-06-01 ENCOUNTER — Ambulatory Visit: Admitting: Bariatrics

## 2024-06-01 ENCOUNTER — Other Ambulatory Visit (HOSPITAL_BASED_OUTPATIENT_CLINIC_OR_DEPARTMENT_OTHER): Payer: Self-pay

## 2024-06-01 ENCOUNTER — Encounter: Payer: Self-pay | Admitting: Bariatrics

## 2024-06-01 VITALS — BP 114/74 | HR 76 | Temp 97.9°F | Ht 64.0 in | Wt 307.0 lb

## 2024-06-01 DIAGNOSIS — R632 Polyphagia: Secondary | ICD-10-CM | POA: Diagnosis not present

## 2024-06-01 DIAGNOSIS — Z6841 Body Mass Index (BMI) 40.0 and over, adult: Secondary | ICD-10-CM | POA: Diagnosis not present

## 2024-06-01 DIAGNOSIS — E559 Vitamin D deficiency, unspecified: Secondary | ICD-10-CM | POA: Diagnosis not present

## 2024-06-01 DIAGNOSIS — E66813 Obesity, class 3: Secondary | ICD-10-CM

## 2024-06-01 MED ORDER — WEGOVY 0.5 MG/0.5ML ~~LOC~~ SOAJ
0.5000 mg | SUBCUTANEOUS | 0 refills | Status: DC
  Filled 2024-06-01 – 2024-06-17 (×3): qty 2, 28d supply, fill #0

## 2024-06-01 MED ORDER — VITAMIN D (ERGOCALCIFEROL) 1.25 MG (50000 UNIT) PO CAPS
50000.0000 [IU] | ORAL_CAPSULE | ORAL | 0 refills | Status: DC
  Filled 2024-06-01: qty 4, 28d supply, fill #0

## 2024-06-01 NOTE — Progress Notes (Signed)
 WEIGHT SUMMARY AND BIOMETRICS  Weight Lost Since Last Visit: 3lb  Weight Gained Since Last Visit: 0   Vitals Temp: 97.9 F (36.6 C) BP: 114/74 Pulse Rate: 76 SpO2: 100 %   Anthropometric Measurements Height: 5' 4 (1.626 m) Weight: (!) 307 lb (139.3 kg) BMI (Calculated): 52.67 Weight at Last Visit: 310lb Weight Lost Since Last Visit: 3lb Weight Gained Since Last Visit: 0 Starting Weight: 326lb Total Weight Loss (lbs): 19 lb (8.618 kg) Peak Weight: 326lb   Body Composition  Body Fat %: 53.3 % Fat Mass (lbs): 160.8 lbs Muscle Mass (lbs): 139.4 lbs Total Body Water (lbs): 102.4 lbs Visceral Fat Rating : 17   Other Clinical Data Fasting: no Labs: no Today's Visit #: 8 Starting Date: 12/16/23    OBESITY Jeanette Hernandez is here to discuss her progress with her obesity treatment plan along with follow-up of her obesity related diagnoses.    Nutrition Plan: the Category 4 plan - 90% adherence.  Current exercise: walking and weight training.  Interim History:  She is down 3 lbs since her last visit.  Eating all of the food on the plan., Protein intake is as prescribed, Is not skipping meals, and Water intake is adequate.   Pharmacotherapy: Jeanette Hernandez is on Wegovy  0.50 mg SQ weekly Adverse side effects: None Hunger is moderately controlled.  Cravings are moderately controlled.  Assessment/Plan:   Jeanette Hernandez endorses excessive hunger.  Medication(s): Wegovy  Effects of medication:  moderately controlled. Cravings are moderately controlled.   Plan: Medication(s): Wegovy  0.50 mg SQ weekly Will increase water, protein and fiber to help assuage hunger.  Will minimize foods that have a high glucose index/load to minimize reactive hypoglycemia.  Will eat her protein first.  Vitamin D  Deficiency Vitamin D  is not at goal of 50.  Most recent vitamin D  level  was 24.2. She is on  prescription ergocalciferol  50,000 IU weekly. Lab Results  Component Value Date   VD25OH 24.2 (L) 12/16/2023    Plan: Refill prescription vitamin D  50,000 IU weekly.   Morbid Obesity: Current BMI BMI (Calculated): 52.67   Pharmacotherapy Plan Continue  Wegovy  0.50 mg SQ weekly  Jeanette Hernandez is currently in the action stage of change. As such, her goal is to continue with weight loss efforts.  She has agreed to the Category 4 plan.  Exercise goals: All adults should avoid inactivity. Some physical activity is better than none, and adults who participate in any amount of physical activity gain some health benefits.  Behavioral modification strategies: increasing lean protein intake, no meal skipping, meal planning , increase water intake, better snacking choices, increasing vegetables, keep healthy foods in the home, and measure portion sizes.  Jeanette Hernandez has agreed to follow-up with our clinic in 4 weeks.    Objective:   VITALS: Per patient if applicable, see vitals. GENERAL: Alert and in no acute distress. CARDIOPULMONARY: No  increased WOB. Speaking in clear sentences.  PSYCH: Pleasant and cooperative. Speech normal rate and rhythm. Affect is appropriate. Insight and judgement are appropriate. Attention is focused, linear, and appropriate.  NEURO: Oriented as arrived to appointment on time with no prompting.   Attestation Statements:   This was prepared with the assistance of Engineer, civil (consulting).  Occasional wrong-word or sound-a-like substitutions may have occurred due to the inherent limitations of voice recognition    Clayborne Daring, DO

## 2024-06-05 ENCOUNTER — Other Ambulatory Visit: Payer: Self-pay | Admitting: Family Medicine

## 2024-06-05 ENCOUNTER — Other Ambulatory Visit (HOSPITAL_BASED_OUTPATIENT_CLINIC_OR_DEPARTMENT_OTHER): Payer: Self-pay

## 2024-06-05 DIAGNOSIS — Z3041 Encounter for surveillance of contraceptive pills: Secondary | ICD-10-CM

## 2024-06-05 NOTE — Telephone Encounter (Signed)
 Copied from CRM [id number]. Topic: Clinical - Medication Refill >> Jun 05, 2024  1:38 PM Tiffini S wrote: Medication: norethindrone -ethinyl estradiol -iron (BLISOVI  FE 1.5/30) 1.5-30 MG-MCG tablet  Has the patient contacted their pharmacy? No (Agent: If no, request that the patient contact the pharmacy for the refill. If patient does not wish to contact the pharmacy document the reason why and proceed with request.) (Agent: If yes, when and what did the pharmacy advise?)  This is the patient's preferred pharmacy:  [facility] [address] [place] [address] Phone: [phone] Fax: [phone]   Is this the correct pharmacy for this prescription? Yes If no, delete pharmacy and type the correct one.   Has the prescription been filled recently? Yes  Is the patient out of the medication? No, just refills the medication but will be out of town for two months  Has the patient been seen for an appointment in the last year OR does the patient have an upcoming appointment? Yes  Can we respond through MyChart? Patient works third shift- please call [phone]  Agent: Please be advised that Rx refills may take up to 3 business days. We ask that you follow-up with your pharmacy.

## 2024-06-08 ENCOUNTER — Other Ambulatory Visit (HOSPITAL_BASED_OUTPATIENT_CLINIC_OR_DEPARTMENT_OTHER): Payer: Self-pay

## 2024-06-08 MED ORDER — NORETHIN ACE-ETH ESTRAD-FE 1.5-30 MG-MCG PO TABS
1.0000 | ORAL_TABLET | Freq: Every day | ORAL | 1 refills | Status: DC
  Filled 2024-06-08: qty 84, 84d supply, fill #0
  Filled 2024-07-03: qty 28, 28d supply, fill #0
  Filled 2024-08-03: qty 28, 28d supply, fill #1

## 2024-06-08 NOTE — Telephone Encounter (Signed)
 OV 12/04/23 Requested Prescriptions  Pending Prescriptions Disp Refills   norethindrone -ethinyl estradiol -iron (BLISOVI  FE 1.5/30) 1.5-30 MG-MCG tablet 84 tablet 1    Sig: Take 1 tablet by mouth daily.     OB/GYN:  Contraceptives Passed - 06/08/2024  5:18 AM      Passed - Last BP in normal range    BP Readings from Last 1 Encounters:  06/01/24 114/74         Passed - Valid encounter within last 12 months    Recent Outpatient Visits           6 months ago Annual visit for general adult medical examination with abnormal findings   [facility] - [facility] Dept Of [place]. [facility] Delbert Clam, MD   11 months ago Screening for diabetes mellitus   [facility] Dept Of [place]. [facility] Delbert Clam, MD   1 year ago Contusion of left lower extremity, initial encounter   [facility] - A Dept Of [place]. [facility] Barnie NOVAK, MD   1 year ago Fatigue, unspecified type   [facility] - A Dept Of [place]. [facility] [place], Jon HERO, [place]   2 years ago Anxiety and depression   [facility] - A Dept Of [place]. [facility] Delbert Clam, MD       Future Appointments             In 5 months Delbert Clam, MD [facility] [place] - A Dept Of . [facility], [facility]            Passed - Patient is not a smoker

## 2024-06-09 ENCOUNTER — Other Ambulatory Visit (HOSPITAL_BASED_OUTPATIENT_CLINIC_OR_DEPARTMENT_OTHER): Payer: Self-pay

## 2024-06-17 ENCOUNTER — Other Ambulatory Visit (HOSPITAL_BASED_OUTPATIENT_CLINIC_OR_DEPARTMENT_OTHER): Payer: Self-pay

## 2024-06-30 ENCOUNTER — Encounter: Payer: Self-pay | Admitting: Bariatrics

## 2024-06-30 ENCOUNTER — Ambulatory Visit: Admitting: Bariatrics

## 2024-06-30 ENCOUNTER — Other Ambulatory Visit (HOSPITAL_BASED_OUTPATIENT_CLINIC_OR_DEPARTMENT_OTHER): Payer: Self-pay

## 2024-06-30 VITALS — BP 113/72 | HR 73 | Temp 97.6°F | Ht 64.0 in | Wt 310.0 lb

## 2024-06-30 DIAGNOSIS — E559 Vitamin D deficiency, unspecified: Secondary | ICD-10-CM | POA: Diagnosis not present

## 2024-06-30 DIAGNOSIS — R632 Polyphagia: Secondary | ICD-10-CM | POA: Diagnosis not present

## 2024-06-30 DIAGNOSIS — F5089 Other specified eating disorder: Secondary | ICD-10-CM

## 2024-06-30 DIAGNOSIS — Z6841 Body Mass Index (BMI) 40.0 and over, adult: Secondary | ICD-10-CM

## 2024-06-30 DIAGNOSIS — E66813 Obesity, class 3: Secondary | ICD-10-CM

## 2024-06-30 MED ORDER — BUPROPION HCL ER (SR) 150 MG PO TB12
150.0000 mg | ORAL_TABLET | Freq: Every day | ORAL | 0 refills | Status: AC
  Filled 2024-06-30: qty 30, 30d supply, fill #0

## 2024-06-30 MED ORDER — WEGOVY 1 MG/0.5ML ~~LOC~~ SOAJ
1.0000 mg | SUBCUTANEOUS | 0 refills | Status: DC
  Filled 2024-06-30 – 2024-07-16 (×3): qty 2, 28d supply, fill #0

## 2024-06-30 MED ORDER — VITAMIN D (ERGOCALCIFEROL) 1.25 MG (50000 UNIT) PO CAPS
50000.0000 [IU] | ORAL_CAPSULE | ORAL | 0 refills | Status: DC
  Filled 2024-06-30: qty 5, 35d supply, fill #0

## 2024-06-30 NOTE — Progress Notes (Signed)
 WEIGHT SUMMARY AND BIOMETRICS  Weight Lost Since Last Visit: 0  Weight Gained Since Last Visit: 3lb   Vitals Temp: 97.6 F (36.4 C) BP: 113/72 Pulse Rate: 73 SpO2: 99 %   Anthropometric Measurements Height: 5' 4 (1.626 m) Weight: (!) 310 lb (140.6 kg) BMI (Calculated): 53.19 Weight at Last Visit: 307lb Weight Lost Since Last Visit: 0 Weight Gained Since Last Visit: 3lb Starting Weight: 326lb Total Weight Loss (lbs): 22 lb (9.979 kg) Peak Weight: 326lb   Body Composition  Body Fat %: 54.1 % Fat Mass (lbs): 167.8 lbs Muscle Mass (lbs): 135 lbs Total Body Water (lbs): 106.4 lbs Visceral Fat Rating : 18   Other Clinical Data Fasting: no Labs: no Today's Visit #: 9 Starting Date: 12/16/23    OBESITY Amayra is here to discuss her progress with her obesity treatment plan along with follow-up of her obesity related diagnoses.    Nutrition Plan: the Category 4 plan - 80% adherence.  Current exercise: boxing and walking  Interim History:  She is up 3 lbs since her last visit. She is getting ready to start her cycle and the bio-impedence scale shows that her body water is up 4 lbs since her last visit.  Eating all of the food on the plan., Protein intake is as prescribed, Is not skipping meals, Not journaling consistently., and Water intake is adequate.   Pharmacotherapy: Delaila is on Wegovy  0.5 mg weekly Adverse side effects: None Hunger is moderately controlled. She is still having some excessive hunger at times.  Cravings are poorly controlled. Her cravings are increased.  Assessment/Plan:   Vitamin D  Deficiency Vitamin D  is not at goal of 50.  Most recent vitamin D  level was 24.2. She is on  prescription ergocalciferol  50,000 IU weekly. Lab Results  Component Value Date   VD25OH 24.2 (L) 12/16/2023    Plan: Refill prescription vitamin D   50,000 IU weekly.   Polyphagia Keiry endorses excessive hunger.  Medication(s): Wegovy  0.5 mg Effects of medication:  moderately controlled. Cravings are moderately controlled.   Plan: Medication(s): Wegovy  1.0 mg SQ weekly Will increase water, protein and fiber to help assuage hunger.  Will minimize foods that have a high glucose index/load to minimize reactive hypoglycemia.   Eating disorder/emotional eating Jorita has had issues with stress eating, emotional eating, and boredom eating.  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  Currently this is moderately controlled. Overall mood is stable. Denies suicidal/homicidal ideation. Medication(s):  none  Plan:  Motivational interviewing as well as evidence-based interventions for health behavior change were utilized today including the discussion of self monitoring techniques, problem-solving barriers and SMART goal setting techniques.  Wellbutrin  150 mg daily in the am Discussed distractions to curb eating behaviors. Discussed activities to do with one's hands in the evening  Be sure  to get adequate rest as lack of rest can trigger appetite.  Have plan in place for stressful events.  Consider other rewards besides food.     Morbid Obesity: Current BMI BMI (Calculated): 53.19   Pharmacotherapy Plan Continue and increase dose  Wegovy  1.0 mg SQ weekly  Jonetta is currently in the action stage of change. As such, her goal is to continue with weight loss efforts.  She has agreed to the Category 4 plan.  Exercise goals: All adults should avoid inactivity. Some physical activity is better than none, and adults who participate in any amount of physical activity gain some health benefits.  Behavioral modification strategies: increasing lean protein intake, no meal skipping, meal planning , increase water intake, better snacking choices, planning for success, avoiding temptations, weigh protein  portions, and measure portion sizes.  Shirlette has agreed to follow-up with our clinic in 4 weeks.    Objective:   VITALS: Per patient if applicable, see vitals. GENERAL: Alert and in no acute distress. CARDIOPULMONARY: No increased WOB. Speaking in clear sentences.  PSYCH: Pleasant and cooperative. Speech normal rate and rhythm. Affect is appropriate. Insight and judgement are appropriate. Attention is focused, linear, and appropriate.  NEURO: Oriented as arrived to appointment on time with no prompting.   Attestation Statements:   This was prepared with the assistance of Engineer, civil (consulting).  Occasional wrong-word or sound-a-like substitutions may have occurred due to the inherent limitations of voice recognition   Clayborne Daring, DO

## 2024-07-01 ENCOUNTER — Telehealth (INDEPENDENT_AMBULATORY_CARE_PROVIDER_SITE_OTHER): Payer: Self-pay | Admitting: *Deleted

## 2024-07-01 NOTE — Telephone Encounter (Signed)
 Jeanette Hernandez (Key: [facility])  Your information has been sent to [facility].

## 2024-07-01 NOTE — Telephone Encounter (Signed)
 Message from plan:  Request Reference Number: [id number]. WEGOVY  INJ 1MG  is approved through 07/01/2025.  Your patient may now fill this prescription and it will be covered..  Authorization Expiration Date: July 01, 2025.   Patient notified via Mychart message.

## 2024-07-02 ENCOUNTER — Other Ambulatory Visit: Payer: Self-pay

## 2024-07-02 DIAGNOSIS — R1032 Left lower quadrant pain: Secondary | ICD-10-CM | POA: Diagnosis not present

## 2024-07-02 DIAGNOSIS — R1031 Right lower quadrant pain: Secondary | ICD-10-CM | POA: Insufficient documentation

## 2024-07-02 DIAGNOSIS — R1030 Lower abdominal pain, unspecified: Secondary | ICD-10-CM | POA: Diagnosis present

## 2024-07-02 LAB — CBC
HCT: 40.2 % (ref 36.0–46.0)
Hemoglobin: 12.5 g/dL (ref 12.0–15.0)
MCH: 23.4 pg — ABNORMAL LOW (ref 26.0–34.0)
MCHC: 31.1 g/dL (ref 30.0–36.0)
MCV: 75.3 fL — ABNORMAL LOW (ref 80.0–100.0)
Platelets: 425 K/uL — ABNORMAL HIGH (ref 150–400)
RBC: 5.34 MIL/uL — ABNORMAL HIGH (ref 3.87–5.11)
RDW: 16.7 % — ABNORMAL HIGH (ref 11.5–15.5)
WBC: 8.7 K/uL (ref 4.0–10.5)
nRBC: 0 % (ref 0.0–0.2)

## 2024-07-02 NOTE — ED Triage Notes (Signed)
 Pt POV reporting upper abd pain, bilateral flank pain and urinary frequency x3 days, also n/v, unable to keep anything down.

## 2024-07-03 ENCOUNTER — Emergency Department (HOSPITAL_BASED_OUTPATIENT_CLINIC_OR_DEPARTMENT_OTHER)
Admission: EM | Admit: 2024-07-03 | Discharge: 2024-07-03 | Disposition: A | Discharge status: Home / Self Care | Attending: Emergency Medicine | Admitting: Emergency Medicine

## 2024-07-03 ENCOUNTER — Other Ambulatory Visit: Payer: Self-pay

## 2024-07-03 ENCOUNTER — Other Ambulatory Visit (HOSPITAL_BASED_OUTPATIENT_CLINIC_OR_DEPARTMENT_OTHER): Payer: Self-pay

## 2024-07-03 ENCOUNTER — Emergency Department (HOSPITAL_BASED_OUTPATIENT_CLINIC_OR_DEPARTMENT_OTHER)

## 2024-07-03 ENCOUNTER — Other Ambulatory Visit (HOSPITAL_COMMUNITY): Payer: Self-pay

## 2024-07-03 DIAGNOSIS — R1030 Lower abdominal pain, unspecified: Secondary | ICD-10-CM

## 2024-07-03 LAB — COMPREHENSIVE METABOLIC PANEL WITH GFR
ALT: 19 U/L (ref 0–44)
AST: 18 U/L (ref 15–41)
Albumin: 4.3 g/dL (ref 3.5–5.0)
Alkaline Phosphatase: 72 U/L (ref 38–126)
Anion gap: 13 (ref 5–15)
BUN: 8 mg/dL (ref 6–20)
CO2: 22 mmol/L (ref 22–32)
Calcium: 9.7 mg/dL (ref 8.9–10.3)
Chloride: 102 mmol/L (ref 98–111)
Creatinine, Ser: 0.81 mg/dL (ref 0.44–1.00)
GFR, Estimated: >60 mL/min (ref >60)
Glucose, Bld: 83 mg/dL (ref 70–99)
Potassium: 3.6 mmol/L (ref 3.5–5.1)
Sodium: 138 mmol/L (ref 135–145)
Total Bilirubin: 0.6 mg/dL (ref 0.0–1.2)
Total Protein: 7.7 g/dL (ref 6.5–8.1)

## 2024-07-03 LAB — URINALYSIS, ROUTINE W REFLEX MICROSCOPIC
Bacteria, UA: NONE SEEN
Bilirubin Urine: NEGATIVE
Glucose, UA: NEGATIVE mg/dL
Ketones, ur: 15 mg/dL — AB
Nitrite: NEGATIVE
Specific Gravity, Urine: 1.028 (ref 1.005–1.030)
pH: 5.5 (ref 5.0–8.0)

## 2024-07-03 LAB — LIPASE, BLOOD: Lipase: 27 U/L (ref 11–51)

## 2024-07-03 LAB — PREGNANCY, URINE: Preg Test, Ur: NEGATIVE

## 2024-07-03 MED ORDER — ONDANSETRON HCL 4 MG/2ML IJ SOLN
4.0000 mg | Freq: Once | INTRAMUSCULAR | Status: AC
  Administered 2024-07-03: 4 mg via INTRAVENOUS
  Filled 2024-07-03: qty 2

## 2024-07-03 MED ORDER — TRAMADOL HCL 50 MG PO TABS
50.0000 mg | ORAL_TABLET | Freq: Four times a day (QID) | ORAL | 0 refills | Status: AC | PRN
  Filled 2024-07-03: qty 15, 4d supply, fill #0

## 2024-07-03 MED ORDER — SODIUM CHLORIDE 0.9 % IV BOLUS
1000.0000 mL | Freq: Once | INTRAVENOUS | Status: AC
  Administered 2024-07-03: 1000 mL via INTRAVENOUS

## 2024-07-03 MED ORDER — KETOROLAC TROMETHAMINE 30 MG/ML IJ SOLN
30.0000 mg | Freq: Once | INTRAMUSCULAR | Status: AC
  Administered 2024-07-03: 30 mg via INTRAVENOUS
  Filled 2024-07-03: qty 1

## 2024-07-03 MED ORDER — IOHEXOL 300 MG/ML  SOLN
100.0000 mL | Freq: Once | INTRAMUSCULAR | Status: AC | PRN
Start: 2024-07-03 — End: 2024-07-03
  Administered 2024-07-03: 100 mL via INTRAVENOUS

## 2024-07-03 NOTE — ED Provider Notes (Signed)
 [place] EMERGENCY DEPARTMENT AT [facility] Provider Note   CSN: [id number] Arrival date & time: 07/02/24  2312     Patient presents with: Abdominal Pain   Jeanette Hernandez is a 23 y.o. female.   Patient is a 23 year old female presenting with complaints of lower abdominal pain.  This has been ongoing for the past 2 days.  She is currently on her menses and does have bleeding and passing clots.  No fevers or chills.  No diarrhea or constipation.  She denies any urinary complaints.  She is currently taking Wegovy  and states that she recently had her dose increased and is concerned that this may be the cause.       Prior to Admission medications   Medication Sig Start Date End Date Taking? Authorizing Provider  albuterol  (PROVENTIL ) (2.5 MG/3ML) 0.083% nebulizer solution Inhale 3 mLs (2.5 mg total) by nebulization every 4 (four) hours as needed for wheezing or shortness of breath. 08/09/23   Jeneal Danita Macintosh, MD  albuterol  (VENTOLIN  HFA) 108 (90 Base) MCG/ACT inhaler Inhale 2 puffs into the lungs every 6 (six) hours as needed for wheezing or shortness of breath. 08/16/23   Jeneal Danita Macintosh, MD  buPROPion  (WELLBUTRIN  SR) 150 MG 12 hr tablet Take 1 tablet (150 mg total) by mouth daily. 06/30/24   Delores Clayborne LABOR, DO  EPINEPHrine  (EPIPEN  2-PAK) 0.3 mg/0.3 mL IJ SOAJ injection Inject 0.3 mg into the muscle as needed for anaphylaxis. 08/16/23   Jeneal Danita Macintosh, MD  fexofenadine  (ALLEGRA ) 180 MG tablet Take 1 tablet (180 mg total) by mouth daily. 08/16/23   Jeneal Danita Macintosh, MD  fluticasone  (FLONASE ) 50 MCG/ACT nasal spray Place 1 spray into both nostrils 2 (two) times daily as needed. 08/16/23   Jeneal Danita Macintosh, MD  montelukast  (SINGULAIR ) 10 MG tablet Take 1 tablet (10 mg total) by mouth at bedtime. 08/16/23   Jeneal Danita Macintosh, MD  Multiple Vitamins-Minerals (MULTIVITAMIN PO) Take by mouth.    [provider]   norethindrone -ethinyl estradiol -iron (BLISOVI  FE 1.5/30) 1.5-30 MG-MCG tablet Take 1 tablet by mouth daily. 06/08/24 06/08/25  Newlin, Enobong, MD  ondansetron  (ZOFRAN ) 4 MG tablet Take 1 tablet (4 mg total) by mouth every 8 (eight) hours as needed. 04/20/24   Delores Clayborne A, DO  semaglutide -weight management (WEGOVY ) 1 MG/0.5ML SOAJ SQ injection Inject 1 mg into the skin once a week. 06/30/24   Delores Clayborne A, DO  Vitamin D , Ergocalciferol , (DRISDOL ) 1.25 MG (50000 UNIT) CAPS capsule Take 1 capsule (50,000 Units total) by mouth every 7 (seven) days. 06/30/24   Delores Clayborne LABOR, DO    Allergies: Fish allergy, Sulfa antibiotics, and Amoxicillin    Review of Systems  All other systems reviewed and are negative.   Updated Vital Signs BP 118/80   Pulse 77   Temp 97.6 F (36.4 C) (Temporal)   Resp 19   Ht 5' 4 (1.626 m)   Wt (!) 137 kg   LMP 06/27/2024 (Approximate)   SpO2 100%   BMI 51.84 kg/m   Physical Exam Vitals and nursing note reviewed.  Constitutional:      General: She is not in acute distress.    Appearance: She is well-developed. She is not diaphoretic.  HENT:     Head: Normocephalic and atraumatic.  Cardiovascular:     Rate and Rhythm: Normal rate and regular rhythm.     Heart sounds: No murmur heard.    No friction rub. No gallop.  Pulmonary:  Effort: Pulmonary effort is normal. No respiratory distress.     Breath sounds: Normal breath sounds. No wheezing.  Abdominal:     General: Bowel sounds are normal. There is no distension.     Palpations: Abdomen is soft.     Tenderness: There is abdominal tenderness in the right lower quadrant, suprapubic area and left lower quadrant. There is no right CVA tenderness, left CVA tenderness, guarding or rebound.  Musculoskeletal:        General: Normal range of motion.     Cervical back: Normal range of motion and neck supple.  Skin:    General: Skin is warm and dry.  Neurological:     General: No focal deficit present.      Mental Status: She is alert and oriented to person, place, and time.     (all labs ordered are listed, but only abnormal results are displayed) Labs Reviewed  CBC - Abnormal; Notable for the following components:      Result Value   RBC 5.34 (*)    MCV 75.3 (*)    MCH 23.4 (*)    RDW 16.7 (*)    Platelets 425 (*)    All other components within normal limits  LIPASE, BLOOD  COMPREHENSIVE METABOLIC PANEL WITH GFR  URINALYSIS, ROUTINE W REFLEX MICROSCOPIC  PREGNANCY, URINE    EKG: None  Radiology: No results found.   Procedures   Medications Ordered in the ED  sodium chloride  0.9 % bolus 1,000 mL (has no administration in time range)  ondansetron  (ZOFRAN ) injection 4 mg (has no administration in time range)  ketorolac  (TORADOL ) 30 MG/ML injection 30 mg (has no administration in time range)                                    Medical Decision Making Amount and/or Complexity of Data Reviewed Labs: ordered. Radiology: ordered.  Risk Prescription drug management.   Patient is a 23 year old female presenting with abdominal pain as described in the HPI.  She arrives with stable vital signs and is afebrile.  Physical examination reveals tenderness across the lower abdomen, but no peritoneal signs.  Laboratory studies obtained including CBC, CMP, and lipase, all of which are unremarkable.  Urinalysis is clear and pregnancy test is negative.  CT scan of the abdomen and pelvis obtained showing no acute process.  At this point, cause of the patient's abdominal pain is unclear.  Her symptoms did seem to coincide with an increased dose of Wegovy .  Whether or not this is the cause, I am uncertain.  Nothing tonight appears emergent and I feel as though patient can safely be discharged.  I will prescribe a small quantity of tramadol  and have her follow-up with primary doctor if symptoms or not improving.     Final diagnoses:  None    ED Discharge Orders     None           Geroldine Berg, MD 07/03/24 [phone]

## 2024-07-03 NOTE — ED Notes (Signed)
 Patient transported to CT

## 2024-07-03 NOTE — Discharge Instructions (Addendum)
 Begin taking tramadol  as prescribed as needed for pain.  Follow-up with primary doctor if not improving in the next few days, and return to the ER if symptoms significantly worsen or change.

## 2024-07-16 ENCOUNTER — Other Ambulatory Visit: Payer: Self-pay

## 2024-07-16 ENCOUNTER — Other Ambulatory Visit (HOSPITAL_BASED_OUTPATIENT_CLINIC_OR_DEPARTMENT_OTHER): Payer: Self-pay

## 2024-07-27 ENCOUNTER — Ambulatory Visit: Admitting: Bariatrics

## 2024-08-03 ENCOUNTER — Ambulatory Visit: Attending: Family Medicine | Admitting: Family Medicine

## 2024-08-03 ENCOUNTER — Other Ambulatory Visit (HOSPITAL_BASED_OUTPATIENT_CLINIC_OR_DEPARTMENT_OTHER): Payer: Self-pay

## 2024-08-03 ENCOUNTER — Other Ambulatory Visit: Payer: Self-pay

## 2024-08-03 ENCOUNTER — Encounter: Payer: Self-pay | Admitting: Family Medicine

## 2024-08-03 VITALS — BP 121/80 | HR 88 | Temp 98.2°F | Ht 64.0 in | Wt 310.6 lb

## 2024-08-03 DIAGNOSIS — Z1159 Encounter for screening for other viral diseases: Secondary | ICD-10-CM

## 2024-08-03 DIAGNOSIS — Z23 Encounter for immunization: Secondary | ICD-10-CM | POA: Diagnosis not present

## 2024-08-03 DIAGNOSIS — Z6841 Body Mass Index (BMI) 40.0 and over, adult: Secondary | ICD-10-CM | POA: Diagnosis not present

## 2024-08-03 DIAGNOSIS — Z309 Encounter for contraceptive management, unspecified: Secondary | ICD-10-CM | POA: Diagnosis not present

## 2024-08-03 MED ORDER — NORETHIN ACE-ETH ESTRAD-FE 1.5-30 MG-MCG PO TABS
1.0000 | ORAL_TABLET | Freq: Every day | ORAL | 11 refills | Status: AC
  Filled 2024-08-03: qty 28, 28d supply, fill #0
  Filled 2024-08-24: qty 28, 28d supply, fill #1
  Filled 2024-09-25 – 2024-09-26 (×2): qty 28, 28d supply, fill #2
  Filled 2024-10-23: qty 28, 28d supply, fill #3

## 2024-08-03 NOTE — Progress Notes (Signed)
 Subjective:  Patient ID: Jeanette Hernandez, female    DOB: 07/18/2001  Age: 23 y.o. MRN: [id number]  CC: Contraception     Discussed the use of AI scribe software for clinical note transcription with the patient, who gave verbal consent to proceed.  History of Present Illness Jeanette Hernandez is a 23 year old female with morbid obesity who presents for a consultation regarding birth control and weight management.  She is concerned that her current hormonal birth control may contribute to weight retention. She is actively working on weight loss and is using Wegovy , recently increasing the dose to one milligram. She has experienced some weight loss but feels progress is slow. Despite regular physical activity, including boxing classes, and dietary improvements, she notes minimal change on the scale but observes changes in clothing fit and receives positive feedback from family and friends. She is considering discussing a switch to Zepbound with her weight management doctor. She previously experienced heavy bleeding and gastrointestinal symptoms with a dosage increase of Wegovy . She does not smoke.  She would like some screening test done due to possibility of unknown occupational exposure as she works at [employer].  Past Medical History:  Diagnosis Date   Asthma    Eczema    as a child   SOBOE (shortness of breath on exertion)    Urticaria     No past surgical history on file.  Family History  Problem Relation Age of Onset   Asthma Father    Obesity Father     Social History   Socioeconomic History   Marital status: Single    Spouse name: Not on file   Number of children: Not on file   Years of education: Not on file   Highest education level: Not on file  Occupational History   Occupation: Student  Tobacco Use   Smoking status: Never    Passive exposure: Yes   Smokeless tobacco: Never  Vaping Use   Vaping status: Never Used  Substance and Sexual Activity   Alcohol use: No     Alcohol/week: 0.0 standard drinks of alcohol   Drug use: No   Sexual activity: Never    Birth control/protection: Abstinence  Other Topics Concern   Not on file  Social History Narrative   Not on file   Social Drivers of Health   Financial Resource Strain: Not on file  Food Insecurity: Not on file  Transportation Needs: Not on file  Physical Activity: Not on file  Stress: Not on file  Social Connections: Not on file    Allergies  Allergen Reactions   Fish Allergy Anaphylaxis    Such as Talapia   Not shellfish   Sulfa Antibiotics Anaphylaxis   Amoxicillin     Outpatient Medications Prior to Visit  Medication Sig Dispense Refill   albuterol  (PROVENTIL ) (2.5 MG/3ML) 0.083% nebulizer solution Inhale 3 mLs (2.5 mg total) by nebulization every 4 (four) hours as needed for wheezing or shortness of breath. 75 mL 1   albuterol  (VENTOLIN  HFA) 108 (90 Base) MCG/ACT inhaler Inhale 2 puffs into the lungs every 6 (six) hours as needed for wheezing or shortness of breath. 6.7 g 2   EPINEPHrine  (EPIPEN  2-PAK) 0.3 mg/0.3 mL IJ SOAJ injection Inject 0.3 mg into the muscle as needed for anaphylaxis. 2 each 2   fexofenadine  (ALLEGRA ) 180 MG tablet Take 1 tablet (180 mg total) by mouth daily. 30 tablet 11   fluticasone  (FLONASE ) 50 MCG/ACT nasal spray Place 1  spray into both nostrils 2 (two) times daily as needed. 16 g 11   montelukast  (SINGULAIR ) 10 MG tablet Take 1 tablet (10 mg total) by mouth at bedtime. 30 tablet 11   Multiple Vitamins-Minerals (MULTIVITAMIN PO) Take by mouth.     ondansetron  (ZOFRAN ) 4 MG tablet Take 1 tablet (4 mg total) by mouth every 8 (eight) hours as needed. 30 tablet 0   semaglutide -weight management (WEGOVY ) 1 MG/0.5ML SOAJ SQ injection Inject 1 mg into the skin once a week. 2 mL 0   Vitamin D , Ergocalciferol , (DRISDOL ) 1.25 MG (50000 UNIT) CAPS capsule Take 1 capsule (50,000 Units total) by mouth every 7 (seven) days. 5 capsule 0   norethindrone -ethinyl  estradiol -iron (BLISOVI  FE 1.5/30) 1.5-30 MG-MCG tablet Take 1 tablet by mouth daily. 84 tablet 1   buPROPion  (WELLBUTRIN  SR) 150 MG 12 hr tablet Take 1 tablet (150 mg total) by mouth daily. (Patient not taking: Reported on 08/03/2024) 30 tablet 0   traMADol  (ULTRAM ) 50 MG tablet Take 1 tablet (50 mg total) by mouth every 6 (six) hours as needed. (Patient not taking: Reported on 08/03/2024) 15 tablet 0   No facility-administered medications prior to visit.     ROS Review of Systems  Constitutional:  Negative for activity change and appetite change.  HENT:  Negative for sinus pressure and sore throat.   Respiratory:  Negative for chest tightness, shortness of breath and wheezing.   Cardiovascular:  Negative for chest pain and palpitations.  Gastrointestinal:  Negative for abdominal distention, abdominal pain and constipation.  Genitourinary: Negative.   Musculoskeletal: Negative.   Psychiatric/Behavioral:  Negative for behavioral problems and dysphoric mood.     Objective:  BP 121/80   Pulse 88   Temp 98.2 F (36.8 C) (Oral)   Ht 5' 4 (1.626 m)   Wt (!) 310 lb 9.6 oz (140.9 kg)   SpO2 98%   BMI 53.31 kg/m      08/03/2024   11:06 AM 07/03/2024    2:50 AM 07/02/2024   11:27 PM  BP/Weight  Systolic BP 121 120 118  Diastolic BP 80 72 80  Wt. (Lbs) 310.6    BMI 53.31 kg/m2      Wt Readings from Last 3 Encounters:  08/03/24 (!) 310 lb 9.6 oz (140.9 kg)  07/02/24 (!) 302 lb (137 kg)  06/30/24 (!) 310 lb (140.6 kg)      Physical Exam Constitutional:      Appearance: She is well-developed.  Cardiovascular:     Rate and Rhythm: Normal rate.     Heart sounds: Normal heart sounds. No murmur heard. Pulmonary:     Effort: Pulmonary effort is normal.     Breath sounds: Normal breath sounds. No wheezing or rales.  Chest:     Chest wall: No tenderness.  Abdominal:     General: Bowel sounds are normal. There is no distension.     Palpations: Abdomen is soft. There is no  mass.     Tenderness: There is no abdominal tenderness.  Musculoskeletal:        General: Normal range of motion.     Right lower leg: No edema.     Left lower leg: No edema.  Neurological:     Mental Status: She is alert and oriented to person, place, and time.  Psychiatric:        Mood and Affect: Mood normal.        Latest Ref Rng & Units 07/02/2024   11:32 PM  12/16/2023    8:33 AM 05/21/2022    4:30 PM  CMP  Glucose 70 - 99 mg/dL 83  85  83   BUN 6 - 20 mg/dL 8  10  8    Creatinine 0.44 - 1.00 mg/dL 9.18  9.27  9.18   Sodium 135 - 145 mmol/L 138  138  145   Potassium 3.5 - 5.1 mmol/L 3.6  4.4  4.4   Chloride 98 - 111 mmol/L 102  103  109   CO2 22 - 32 mmol/L 22  21  20    Calcium 8.9 - 10.3 mg/dL 9.7  9.4  9.5   Total Protein 6.5 - 8.1 g/dL 7.7  7.0  7.0   Total Bilirubin 0.0 - 1.2 mg/dL 0.6  0.2  <9.7   Alkaline Phos 38 - 126 U/L 72  75  73   AST 15 - 41 U/L 18  14  14    ALT 0 - 44 U/L 19  13  18      Lipid Panel     Component Value Date/Time   CHOL 145 12/16/2023 0833   TRIG 62 12/16/2023 0833   HDL 57 12/16/2023 0833   LDLCALC 75 12/16/2023 0833    CBC    Component Value Date/Time   WBC 8.7 07/02/2024 2332   RBC 5.34 (H) 07/02/2024 2332   HGB 12.5 07/02/2024 2332   HGB 11.6 08/09/2022 1430   HCT 40.2 07/02/2024 2332   HCT 38.1 08/09/2022 1430   PLT 425 (H) 07/02/2024 2332   PLT 424 08/09/2022 1430   MCV 75.3 (L) 07/02/2024 2332   MCV 75 (L) 08/09/2022 1430   MCH 23.4 (L) 07/02/2024 2332   MCHC 31.1 07/02/2024 2332   RDW 16.7 (H) 07/02/2024 2332   RDW 14.5 08/09/2022 1430   LYMPHSABS 2.2 08/09/2022 1430   EOSABS 0.1 08/09/2022 1430   BASOSABS 0.0 08/09/2022 1430    Lab Results  Component Value Date   HGBA1C 5.5 06/19/2023       Assessment & Plan Obesity On Wegovy  1 mg with some weight loss. Considering Zepbound for faster results. Insurance coverage challenges discussed. - Continue Wegovy  1 mg and consult weight management specialist about  Zepbound. - Encourage lifestyle modifications: diet and exercise. - Discuss insurance coverage and options with weight management specialist.  Contraceptive management On Blisovi  Fe 1.5/30 with concerns about weight gain. Considering non-hormonal IUD for weight management. Current benefits include menstrual regulation and reduced pregnancy risk. - Continue Blisovi  Fe 1.5/30. - Send prescription refill to pharmacy. - Discuss option of switching to non-hormonal IUD (copper T) if desired.  General Health Maintenance/Screening for viral disease Occupational exposure at [employer]. - Order hepatitis B titer for immunity check. - Order HIV test. - Discuss herpes testing only if active sore is present.      Meds ordered this encounter  Medications   norethindrone -ethinyl estradiol -iron (BLISOVI  FE 1.5/30) 1.5-30 MG-MCG tablet    Sig: Take 1 tablet by mouth daily.    Dispense:  30 tablet    Refill:  11    Follow-up: Return for previously scheduled appointment, CPE/ Preventive Health Exam.       Corrina Sabin, MD, FAAFP. [facility] [place], [place] [phone]   08/03/2024, 11:59 AM

## 2024-08-03 NOTE — Patient Instructions (Signed)
 VISIT SUMMARY:  Today, you came in for a consultation about birth control and weight management. We discussed your current use of hormonal birth control and your efforts to lose weight with Wegovy . You shared your concerns about the slow progress in weight loss and the potential impact of your birth control on weight retention. We also reviewed your general health maintenance needs.  YOUR PLAN:  -OBESITY: Obesity means having an excessive amount of body fat. You are currently using Wegovy  at a dose of 1 mg and have experienced some weight loss. We discussed the possibility of switching to Zepbound for potentially faster results, but you should consult your weight management specialist about this option. Continue with your current dose of Wegovy , maintain your diet and exercise routines, and discuss insurance coverage and options with your specialist.  -CONTRACEPTIVE MANAGEMENT: You are currently using Blisovi  Fe 1.5/30 for birth control, which helps regulate your menstrual cycle and reduce the risk of pregnancy. You expressed concerns about weight gain and are considering a non-hormonal IUD as an alternative. For now, continue with Blisovi  Fe 1.5/30, and I have sent a prescription refill to your pharmacy. If you decide to switch to a non-hormonal IUD, we can discuss the copper T option.  -GENERAL HEALTH MAINTENANCE: We discussed your occupational exposure at [employer] and the limitations of herpes testing without active sores. We will check your immunity to hepatitis B and test for HIV. Herpes testing will only be done if you have an active sore.  INSTRUCTIONS:  Please follow up with your weight management specialist to discuss the possibility of switching to Zepbound and to review your insurance coverage options. Continue with your current birth control and let us  know if you decide to switch to a non-hormonal IUD. We will proceed with the hepatitis B titer and HIV test as discussed. If you have any  active sores, please come in for herpes testing.

## 2024-08-04 ENCOUNTER — Ambulatory Visit: Payer: Self-pay | Admitting: Family Medicine

## 2024-08-04 LAB — HEPATITIS B SURFACE ANTIBODY, QUANTITATIVE: Hepatitis B Surf Ab Quant: 800 m[IU]/mL

## 2024-08-04 LAB — HIV ANTIBODY (ROUTINE TESTING W REFLEX): HIV Screen 4th Generation wRfx: NONREACTIVE

## 2024-08-05 ENCOUNTER — Other Ambulatory Visit: Payer: Self-pay

## 2024-08-05 ENCOUNTER — Ambulatory Visit: Admitting: Bariatrics

## 2024-08-05 ENCOUNTER — Other Ambulatory Visit (HOSPITAL_BASED_OUTPATIENT_CLINIC_OR_DEPARTMENT_OTHER): Payer: Self-pay

## 2024-08-05 ENCOUNTER — Encounter: Payer: Self-pay | Admitting: Bariatrics

## 2024-08-05 VITALS — BP 131/77 | HR 93 | Temp 97.7°F | Ht 64.0 in | Wt 305.0 lb

## 2024-08-05 DIAGNOSIS — Z6841 Body Mass Index (BMI) 40.0 and over, adult: Secondary | ICD-10-CM

## 2024-08-05 DIAGNOSIS — E88819 Insulin resistance, unspecified: Secondary | ICD-10-CM | POA: Diagnosis not present

## 2024-08-05 DIAGNOSIS — R632 Polyphagia: Secondary | ICD-10-CM

## 2024-08-05 MED ORDER — VITAMIN D (ERGOCALCIFEROL) 1.25 MG (50000 UNIT) PO CAPS
50000.0000 [IU] | ORAL_CAPSULE | ORAL | 0 refills | Status: DC
  Filled 2024-08-05: qty 5, 35d supply, fill #0

## 2024-08-05 MED ORDER — WEGOVY 1 MG/0.5ML ~~LOC~~ SOAJ
1.0000 mg | SUBCUTANEOUS | 0 refills | Status: DC
  Filled 2024-08-05: qty 2, 28d supply, fill #0

## 2024-08-05 NOTE — Progress Notes (Signed)
 WEIGHT SUMMARY AND BIOMETRICS  Weight Lost Since Last Visit: 5lb  Weight Gained Since Last Visit: 0   Vitals Temp: 97.7 F (36.5 C) BP: 131/77 Pulse Rate: 93 SpO2: 98 %   Anthropometric Measurements Height: 5' 4 (1.626 m) Weight: (!) 305 lb (138.3 kg) BMI (Calculated): 52.33 Weight at Last Visit: 310lb Weight Lost Since Last Visit: 5lb Weight Gained Since Last Visit: 0 Starting Weight: 326lb Total Weight Loss (lbs): 27 lb (12.2 kg) Peak Weight: 326lb   Body Composition  Body Fat %: 65.2 % Fat Mass (lbs): 199.2 lbs Muscle Mass (lbs): 100.8 lbs Visceral Fat Rating : 22   Other Clinical Data Fasting: no Labs: no Today's Visit #: 10 Starting Date: 12/16/23    OBESITY Jeanette Hernandez is here to discuss her progress with her obesity treatment plan along with follow-up of her obesity related diagnoses.    Nutrition Plan: the Category 4 plan - 90% adherence.  Current exercise: boxing  Interim History:  She is down 5 lbs since her last visit. We did increase her Wegovy  up to 1 mg at the last visit. She is eating right and doing well.  Eating all of the food on the plan., Protein intake is as prescribed, Is skipping meals, and Water intake is adequate.   Pharmacotherapy: Jeanette Hernandez is on Wegovy  1.0 mg SQ weekly Adverse side effects: abdominal pain with the higher dose.  Hunger is moderately controlled.  Cravings are well controlled.  Assessment/Plan:   Jeanette Hernandez endorses excessive hunger.  Medication(s): Wegovy   Effects of medication (appetite) :  moderately controlled. Cravings are moderately controlled.   Plan: Medication(s): Wegovy  1.0 mg SQ weekly (will stay at the current dose as her abdominal pain is getting better).  Will start a probiotic and will eat yogurt with live cultures daily.  Will increase water, protein and fiber to help assuage  hunger.  Will minimize foods that have a high glucose index/load to minimize reactive hypoglycemia.   Insulin  Resistance Jeanette Hernandez has had elevated fasting insulin  readings. Goal is HgbA1c < 5.7, fasting insulin  at l0 or less, and preferably at 5.  She denies excessive polyphagia. Medication(s): Wegovy  Lab Results  Component Value Date   HGBA1C 5.5 06/19/2023   Lab Results  Component Value Date   INSULIN  14.5 12/16/2023    Plan Medication(s): Wegovy  1.0 mg SQ weekly Will work on the agreed upon plan. Will minimize refined carbohydrates ( sweets and starches), and focus more on complex carbohydrates.  Increase the micronutrients found in leafy greens, which include magnesium, polyphenols, and vitamin C which have been postulated to help with insulin  sensitivity. Minimize fast food and cook more meals at home.  Increase fiber to 25 to 30 grams daily.  Information sheet on  Insulin  Resistance and Prediabetes.     Morbid Obesity: Current BMI BMI (Calculated): 52.33   Pharmacotherapy Plan Continue and refill  Wegovy  1.0 mg SQ weekly  Jeanette Hernandez is currently in the action stage of change. As such, her goal is to continue with weight loss efforts.  She has agreed to the Category 4 plan.  Exercise goals: All adults should avoid inactivity. Some physical activity is better than none, and adults who participate in any amount of physical activity gain some health benefits. She is doing boxing and weight training.   Behavioral modification strategies: increasing lean protein intake, decreasing simple carbohydrates , decrease eating out, meal planning , planning for success, increasing vegetables, increasing fiber rich foods, increase frequency of journaling, weigh protein portions, measure portion sizes, and mindful eating.  Jeanette Hernandez has agreed to follow-up with our clinic in 4 weeks.   Objective:   VITALS: Per patient if applicable, see vitals. GENERAL: Alert and in no acute  distress. CARDIOPULMONARY: No increased WOB. Speaking in clear sentences.  PSYCH: Pleasant and cooperative. Speech normal rate and rhythm. Affect is appropriate. Insight and judgement are appropriate. Attention is focused, linear, and appropriate.  NEURO: Oriented as arrived to appointment on time with no prompting.   Attestation Statements:   This was prepared with the assistance of Engineer, Civil (consulting).  Occasional wrong-word or sound-a-like substitutions may have occurred due to the inherent limitations of voice recognition   Clayborne Daring, DO

## 2024-08-07 ENCOUNTER — Other Ambulatory Visit (HOSPITAL_BASED_OUTPATIENT_CLINIC_OR_DEPARTMENT_OTHER): Payer: Self-pay

## 2024-08-14 ENCOUNTER — Other Ambulatory Visit (HOSPITAL_BASED_OUTPATIENT_CLINIC_OR_DEPARTMENT_OTHER): Payer: Self-pay

## 2024-08-21 ENCOUNTER — Other Ambulatory Visit: Payer: Self-pay

## 2024-08-21 ENCOUNTER — Encounter: Payer: Self-pay | Admitting: Allergy

## 2024-08-21 ENCOUNTER — Ambulatory Visit: Payer: Commercial Managed Care - PPO | Admitting: Allergy

## 2024-08-21 ENCOUNTER — Other Ambulatory Visit (HOSPITAL_BASED_OUTPATIENT_CLINIC_OR_DEPARTMENT_OTHER): Payer: Self-pay

## 2024-08-21 VITALS — BP 110/80 | HR 88 | Temp 98.3°F | Resp 20 | Ht 64.0 in | Wt 314.3 lb

## 2024-08-21 DIAGNOSIS — J4520 Mild intermittent asthma, uncomplicated: Secondary | ICD-10-CM | POA: Diagnosis not present

## 2024-08-21 DIAGNOSIS — T7800XD Anaphylactic reaction due to unspecified food, subsequent encounter: Secondary | ICD-10-CM

## 2024-08-21 DIAGNOSIS — J3089 Other allergic rhinitis: Secondary | ICD-10-CM

## 2024-08-21 MED ORDER — ALBUTEROL SULFATE HFA 108 (90 BASE) MCG/ACT IN AERS
2.0000 | INHALATION_SPRAY | Freq: Four times a day (QID) | RESPIRATORY_TRACT | 2 refills | Status: AC | PRN
  Filled 2024-08-21: qty 6.7, 25d supply, fill #0

## 2024-08-21 MED ORDER — MONTELUKAST SODIUM 10 MG PO TABS
10.0000 mg | ORAL_TABLET | Freq: Every day | ORAL | 11 refills | Status: AC
  Filled 2024-08-21: qty 30, 30d supply, fill #0
  Filled 2024-10-09: qty 30, 30d supply, fill #1

## 2024-08-21 MED ORDER — ALBUTEROL SULFATE (2.5 MG/3ML) 0.083% IN NEBU
2.5000 mg | INHALATION_SOLUTION | RESPIRATORY_TRACT | 1 refills | Status: AC | PRN
  Filled 2024-08-21 – 2024-10-12 (×2): qty 75, 5d supply, fill #0

## 2024-08-21 MED ORDER — EPINEPHRINE 0.3 MG/0.3ML IJ SOAJ
0.3000 mg | INTRAMUSCULAR | 2 refills | Status: AC | PRN
  Filled 2024-08-21: qty 2, 30d supply, fill #0

## 2024-08-21 NOTE — Progress Notes (Signed)
 Follow-up Note  RE: Jeanette Hernandez MRN: [id number] DOB: April 22, 2001 Date of Office Visit: 08/21/2024   History of present illness: Jeanette Hernandez is a 23 y.o. female presenting today for follow-up of asthma, allergic rhinitis, food allergy.  She was last seen in the office on 08/16/23 by myself.  Discussed the use of AI scribe software for clinical note transcription with the patient, who gave verbal consent to proceed.  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 She did not experience any significant illness or need for her rescue medications, such as Albuterol  or her EpiPen .  She continues to take Allegra  (fexofenadine ) regularly. Additionally, she has been using natural herbs and teas daily, which she feels have contributed to her symptom control.  She has not needed to use nasal sprays and has only used eye drops a couple of times, attributing this to lack of sleep rather than allergy symptoms. She also takes Singulair  (montelukast ) during colder weather to prevent illness, noting that she can skip it during the summer without issue.  She is vigilant in avoiding allergens, particularly when dining out, due to her severe allergy to fish and related products. She recounts experiences where she had to verify ingredients to avoid exposure, emphasizing the importance of caution in such situations. Her family, including her mother and sister, are knowledgeable about using her EpiPen  in case of an emergency.      Review of systems: 10pt ROS negative unless noted above in HPI   Past medical/social/surgical/family history have been reviewed and are unchanged unless specifically indicated below.  No changes  Medication List: Current Outpatient Medications  Medication Sig Dispense Refill   fexofenadine  (ALLEGRA ) 180 MG tablet  Take 1 tablet (180 mg total) by mouth daily. 30 tablet 11   fluticasone  (FLONASE ) 50 MCG/ACT nasal spray Place 1 spray into both nostrils 2 (two) times daily as needed. 16 g 11   Multiple Vitamins-Minerals (MULTIVITAMIN PO) Take by mouth.     norethindrone -ethinyl estradiol -iron (BLISOVI  FE 1.5/30) 1.5-30 MG-MCG tablet Take 1 tablet by mouth daily. 30 tablet 11   ondansetron  (ZOFRAN ) 4 MG tablet Take 1 tablet (4 mg total) by mouth every 8 (eight) hours as needed. 30 tablet 0   semaglutide -weight management (WEGOVY ) 1 MG/0.5ML SOAJ SQ injection Inject 1 mg into the skin once a week. 2 mL 0   Vitamin D , Ergocalciferol , (DRISDOL ) 1.25 MG (50000 UNIT) CAPS capsule Take 1 capsule (50,000 Units total) by mouth every 7 (seven) days. 5 capsule 0   albuterol  (PROVENTIL ) (2.5 MG/3ML) 0.083% nebulizer solution Inhale 3 mLs (2.5 mg total) by nebulization every 4 (four) hours as needed for wheezing or shortness of breath. 75 mL 1   albuterol  (VENTOLIN  HFA) 108 (90 Base) MCG/ACT inhaler Inhale 2 puffs into the lungs every 6 (six) hours as needed for wheezing or shortness of breath. 6.7 g 2   buPROPion  (WELLBUTRIN  SR) 150 MG 12 hr tablet Take 1 tablet (150 mg total) by mouth daily. (Patient not taking: Reported on 08/21/2024) 30 tablet 0   EPINEPHrine  (EPIPEN  2-PAK) 0.3 mg/0.3 mL IJ SOAJ injection Inject 0.3 mg into the muscle as needed for anaphylaxis. 2 each 2   montelukast  (SINGULAIR ) 10 MG tablet Take 1 tablet (10 mg total) by mouth at bedtime. 30 tablet 11   traMADol  (ULTRAM ) 50 MG  tablet Take 1 tablet (50 mg total) by mouth every 6 (six) hours as needed. (Patient not taking: Reported on 08/21/2024) 15 tablet 0   No current facility-administered medications for this visit.     Known medication allergies: Allergies  Allergen Reactions   Fish Allergy Anaphylaxis    Such as Talapia   Not shellfish   Sulfa Antibiotics Anaphylaxis   Amoxicillin      Physical examination: Blood pressure 110/80, pulse 88,  temperature 98.3 F (36.8 C), resp. rate 20, height 5' 4 (1.626 m), weight (!) 314 lb 4.8 oz (142.6 kg), SpO2 98%.  General: Alert, interactive, in no acute distress. HEENT: PERRLA, TMs pearly gray, turbinates non-edematous without discharge, post-pharynx non erythematous. Neck: Supple without lymphadenopathy. Lungs: Clear to auscultation without wheezing, rhonchi or rales. {no increased work of breathing. CV: Normal S1, S2 without murmurs. Abdomen: Nondistended, nontender. Skin: Warm and dry, without lesions or rashes. Extremities:  No clubbing, cyanosis or edema. Neuro:   Grossly intact.  Diagnostics/Labs:  Spirometry: FEV1: 2.9L 102%, FVC: 3.57L 110%, ratio consistent with nonobstructive pattern  Assessment and plan:   Allergic rhinitis Continue avoidance measures for mites and cats.   Continue Allegra  (fexofenadine ) daily Continue Singulair  (montelukast ) 10mg  daily at night as a below May use Flonase  (fluticasone ) nasal spray 1 spray per nostril twice a day as needed for nasal congestion.  If having persistent runny nose you can use Astepro (this is OTC) nasal spray 2 sprays each nostril twice a day as needed For itchy/watery eyes can use as needed allergy based eyedrop (some OTC options would be Pataday, Alaway, Zaditor or Lastacaft)  Asthma Daily controller medication(s): Singulair  (montelukast ) 10mg  daily at night during colder weather months May use albuterol  rescue inhaler 2 puffs or nebulizer every 4 to 6 hours as needed for shortness of breath, chest tightness, coughing, and wheezing. May use albuterol  rescue inhaler 2 puffs 5 to 15 minutes prior to strenuous physical activities. Monitor frequency of use.  Asthma control goals:  Full participation in all desired activities (may need albuterol  before activity) Albuterol  use two times or less a week on average (not counting use with activity) Cough interfering with sleep two times or less a month Oral steroids no more than  once a year No hospitalizations  Food allergy Continue strict avoidance of fish. For mild symptoms you can take over the counter antihistamines such as Benadryl  and monitor symptoms closely. If symptoms worsen or if you have severe symptoms including breathing issues, throat closure, significant swelling, whole body hives, severe diarrhea and vomiting, lightheadedness then inject epinephrine  and seek immediate medical care afterwards. Action plan in place.   Follow up in 12 months or sooner if needed.   I appreciate the opportunity to take part in [place] care. Please do not hesitate to contact me with questions.  Sincerely,   Danita Brain, MD Allergy/Immunology [facility]

## 2024-08-21 NOTE — Patient Instructions (Addendum)
 Allergic rhinitis Continue avoidance measures for mites and cats.   Continue Allegra  (fexofenadine ) daily Continue Singulair  (montelukast ) 10mg  daily at night as a below May use Flonase  (fluticasone ) nasal spray 1 spray per nostril twice a day as needed for nasal congestion.  If having persistent runny nose you can use Astepro (this is OTC) nasal spray 2 sprays each nostril twice a day as needed For itchy/watery eyes can use as needed allergy based eyedrop (some OTC options would be Pataday, Alaway, Zaditor or Lastacaft)  Asthma Daily controller medication(s): Singulair  (montelukast ) 10mg  daily at night during colder weather months May use albuterol  rescue inhaler 2 puffs or nebulizer every 4 to 6 hours as needed for shortness of breath, chest tightness, coughing, and wheezing. May use albuterol  rescue inhaler 2 puffs 5 to 15 minutes prior to strenuous physical activities. Monitor frequency of use.  Asthma control goals:  Full participation in all desired activities (may need albuterol  before activity) Albuterol  use two times or less a week on average (not counting use with activity) Cough interfering with sleep two times or less a month Oral steroids no more than once a year No hospitalizations  Food allergy Continue strict avoidance of fish. For mild symptoms you can take over the counter antihistamines such as Benadryl  and monitor symptoms closely. If symptoms worsen or if you have severe symptoms including breathing issues, throat closure, significant swelling, whole body hives, severe diarrhea and vomiting, lightheadedness then inject epinephrine  and seek immediate medical care afterwards. Action plan in place.   Follow up in 12 months or sooner if needed.

## 2024-08-24 ENCOUNTER — Other Ambulatory Visit (HOSPITAL_BASED_OUTPATIENT_CLINIC_OR_DEPARTMENT_OTHER): Payer: Self-pay

## 2024-08-24 ENCOUNTER — Other Ambulatory Visit: Payer: Self-pay | Admitting: Allergy

## 2024-08-24 MED ORDER — FLUTICASONE PROPIONATE 50 MCG/ACT NA SUSP
1.0000 | Freq: Two times a day (BID) | NASAL | 11 refills | Status: AC | PRN
  Filled 2024-08-24: qty 16, 30d supply, fill #0

## 2024-08-26 ENCOUNTER — Other Ambulatory Visit (HOSPITAL_BASED_OUTPATIENT_CLINIC_OR_DEPARTMENT_OTHER): Payer: Self-pay

## 2024-08-31 ENCOUNTER — Encounter: Payer: Self-pay | Admitting: Bariatrics

## 2024-08-31 ENCOUNTER — Other Ambulatory Visit (HOSPITAL_BASED_OUTPATIENT_CLINIC_OR_DEPARTMENT_OTHER): Payer: Self-pay

## 2024-08-31 ENCOUNTER — Ambulatory Visit (INDEPENDENT_AMBULATORY_CARE_PROVIDER_SITE_OTHER): Admitting: Bariatrics

## 2024-08-31 VITALS — BP 135/83 | HR 78 | Ht 64.0 in | Wt 310.0 lb

## 2024-08-31 DIAGNOSIS — E559 Vitamin D deficiency, unspecified: Secondary | ICD-10-CM

## 2024-08-31 DIAGNOSIS — E669 Obesity, unspecified: Secondary | ICD-10-CM | POA: Diagnosis not present

## 2024-08-31 DIAGNOSIS — Z6841 Body Mass Index (BMI) 40.0 and over, adult: Secondary | ICD-10-CM | POA: Diagnosis not present

## 2024-08-31 DIAGNOSIS — R632 Polyphagia: Secondary | ICD-10-CM | POA: Diagnosis not present

## 2024-08-31 MED ORDER — VITAMIN D (ERGOCALCIFEROL) 1.25 MG (50000 UNIT) PO CAPS
50000.0000 [IU] | ORAL_CAPSULE | ORAL | 0 refills | Status: DC
  Filled 2024-08-31: qty 5, 35d supply, fill #0

## 2024-08-31 MED ORDER — WEGOVY 1 MG/0.5ML ~~LOC~~ SOAJ
1.0000 mg | SUBCUTANEOUS | 0 refills | Status: DC
  Filled 2024-08-31: qty 2, 28d supply, fill #0

## 2024-08-31 NOTE — Progress Notes (Signed)
 WEIGHT SUMMARY AND BIOMETRICS  Weight Lost Since Last Visit: 0  Weight Gained Since Last Visit: 5lb   Vitals BP: 135/83 Pulse Rate: 78 SpO2: 100 %   Anthropometric Measurements Height: 5' 4 (1.626 m) Weight: (!) 310 lb (140.6 kg) BMI (Calculated): 53.19 Weight at Last Visit: 305lb Weight Lost Since Last Visit: 0 Weight Gained Since Last Visit: 5lb Starting Weight: 326lb Total Weight Loss (lbs): 22 lb (9.979 kg) Peak Weight: 326lb   Body Composition  Body Fat %: 54.7 % Fat Mass (lbs): 169.6 lbs Muscle Mass (lbs): 133.4 lbs Visceral Fat Rating : 18   Other Clinical Data Fasting: no Labs: no Today's Visit #: 11 Starting Date: 12/16/23    OBESITY Jeanette Hernandez is here to discuss her progress with her obesity treatment plan along with follow-up of her obesity related diagnoses.    Nutrition Plan: the Category 4 plan - 80% adherence.  Current exercise: weightlifting and boxing  Interim History:  She is up 5 lbs since her last visit. She has not been eating that much.  Eating all of the food on the plan., Protein intake is as prescribed, Is skipping meals, and Water intake is adequate.   Pharmacotherapy: Jeanette Hernandez is on Wegovy  1.0 mg SQ weekly Adverse side effects: Nausea, but improving Hunger is moderately controlled.  Cravings are moderately controlled.  Assessment/Plan:   Vitamin D  Deficiency Vitamin D  is not at goal of 50.  Most recent vitamin D  level was 24.2. She is on  prescription ergocalciferol  50,000 IU weekly. Lab Results  Component Value Date   VD25OH 24.2 (L) 12/16/2023    Plan: Refill prescription vitamin D  50,000 IU weekly.  Will work on sleep hygiene.    Polyphagia Jeanette Hernandez endorses excessive hunger. She is having less nausea with the Wegovy .  Medication(s): Wegovy  Effects of medication:  moderately controlled. Cravings are  moderately controlled.   Plan: Medication(s): Wegovy  1.0 mg SQ weekly. Will stay on the current dose.  Will increase water, protein and fiber to help assuage hunger.  Will minimize foods that have a high glucose index/load to minimize reactive hypoglycemia.  Will continue probiotic type foods.  Will not skip meals.     Generalized Obesity: Current BMI BMI (Calculated): 53.19   Pharmacotherapy Plan Continue and refill  Wegovy  1.0 mg SQ weekly  Jeanette Hernandez is currently in the action stage of change. As such, her goal is to continue with weight loss efforts.  She has agreed to the Category 4 plan.  Exercise goals: All adults should avoid inactivity. Some physical activity is better than none, and adults who participate in any amount of physical activity gain some health benefits. She will continue her boxing and walking.   Behavioral modification strategies: increasing lean protein intake, decreasing simple carbohydrates , no meal skipping, decrease eating out, meal planning , planning for success, avoiding temptations, keep healthy foods in  the home, holiday eating strategies , and increase frequency of journaling.  Jeanette Hernandez has agreed to follow-up with our clinic in 4 weeks.      Objective:   VITALS: Per patient if applicable, see vitals. GENERAL: Alert and in no acute distress. CARDIOPULMONARY: No increased WOB. Speaking in clear sentences.  PSYCH: Pleasant and cooperative. Speech normal rate and rhythm. Affect is appropriate. Insight and judgement are appropriate. Attention is focused, linear, and appropriate.  NEURO: Oriented as arrived to appointment on time with no prompting.   Attestation Statements:   This was prepared with the assistance of Engineer, Civil (consulting).  Occasional wrong-word or sound-a-like substitutions may have occurred due to the inherent limitations of voice recognition.  Clayborne Daring, DO

## 2024-09-26 ENCOUNTER — Other Ambulatory Visit (HOSPITAL_BASED_OUTPATIENT_CLINIC_OR_DEPARTMENT_OTHER): Payer: Self-pay

## 2024-09-28 ENCOUNTER — Ambulatory Visit: Admitting: Bariatrics

## 2024-09-28 ENCOUNTER — Encounter: Payer: Self-pay | Admitting: Bariatrics

## 2024-09-28 ENCOUNTER — Other Ambulatory Visit (HOSPITAL_BASED_OUTPATIENT_CLINIC_OR_DEPARTMENT_OTHER): Payer: Self-pay

## 2024-09-28 VITALS — BP 116/81 | HR 96 | Ht 64.0 in | Wt 311.0 lb

## 2024-09-28 DIAGNOSIS — E66813 Obesity, class 3: Secondary | ICD-10-CM

## 2024-09-28 DIAGNOSIS — Z6841 Body Mass Index (BMI) 40.0 and over, adult: Secondary | ICD-10-CM

## 2024-09-28 DIAGNOSIS — R632 Polyphagia: Secondary | ICD-10-CM

## 2024-09-28 DIAGNOSIS — E559 Vitamin D deficiency, unspecified: Secondary | ICD-10-CM

## 2024-09-28 MED ORDER — WEGOVY 1 MG/0.5ML ~~LOC~~ SOAJ
1.0000 mg | SUBCUTANEOUS | 0 refills | Status: DC
  Filled 2024-09-28: qty 2, 28d supply, fill #0

## 2024-09-28 MED ORDER — VITAMIN D (ERGOCALCIFEROL) 1.25 MG (50000 UNIT) PO CAPS
50000.0000 [IU] | ORAL_CAPSULE | ORAL | 0 refills | Status: DC
  Filled 2024-09-28: qty 5, 35d supply, fill #0

## 2024-09-28 NOTE — Progress Notes (Signed)
 "                                                                                                             WEIGHT SUMMARY AND BIOMETRICS  Weight Lost Since Last Visit: 0  Weight Gained Since Last Visit: 1lb   Vitals BP: 116/81 Pulse Rate: 96 SpO2: 95 %   Anthropometric Measurements Height: 5' 4 (1.626 m) Weight: (!) 311 lb (141.1 kg) BMI (Calculated): 53.36 Weight at Last Visit: 310lb Weight Lost Since Last Visit: 0 Weight Gained Since Last Visit: 1lb Starting Weight: 326lb Total Weight Loss (lbs): 21 lb (9.526 kg) Peak Weight: 326lb   Body Composition  Body Fat %: 53 % Fat Mass (lbs): 165 lbs Muscle Mass (lbs): 138.8 lbs Visceral Fat Rating : 18   Other Clinical Data Fasting: no Labs: no Today's Visit #: 12 Starting Date: 12/16/23    OBESITY Analia is here to discuss her progress with her obesity treatment plan along with follow-up of her obesity related diagnoses.    Nutrition Plan: the Category 4 plan - 80% adherence.  Current exercise: boxing  Interim History:  She is up 1 lb since her last visit. She has been busy at work.  Eating all of the food on the plan., Protein intake is as prescribed, Is not skipping meals, and Water intake is adequate.   Pharmacotherapy: Kathyann is on Wegovy  1.0 mg SQ weekly Adverse side effects: None Hunger is moderately controlled.  Cravings are moderately controlled.  Assessment/Plan:   Yemariam Magar endorses excessive hunger.  Medication(s): Wegovy  Effects of medication (appetite):  moderately controlled. Cravings are moderately controlled.   Plan: Medication(s): Wegovy  1.0 mg SQ weekly Will increase water, protein and fiber to help assuage hunger.  Will minimize foods that have a high glucose index/load to minimize reactive hypoglycemia.  Will increase her water.  Will start with a vitamin supplement.   Vitamin D  Deficiency Vitamin D  is not at goal of 50.  Most recent vitamin D  level was 24.2. She  is on  prescription ergocalciferol  50,000 IU weekly. Lab Results  Component Value Date   VD25OH 24.2 (L) 12/16/2023    Plan: Refill prescription vitamin D  50,000 IU weekly.    Morbid Obesity: Current BMI BMI (Calculated): 53.36   Pharmacotherapy Plan Continue and refill  Wegovy  1.0 mg SQ weekly  Kaedyn is currently in the action stage of change. As such, her goal is to continue with weight loss efforts.  She has agreed to the Category 4 plan.  Exercise goals: All adults should avoid inactivity. Some physical activity is better than none, and adults who participate in any amount of physical activity gain some health benefits. She will continue her boxing and walking.   Behavioral modification strategies: increasing lean protein intake, no meal skipping, meal planning , increase water intake, better snacking choices, planning for success, increasing vegetables, avoiding temptations, keep healthy foods in the home, weigh protein portions, measure portion sizes, work on smaller portions, and mindful eating.  Haden has agreed to follow-up with our  clinic in 4 weeks.     Objective:   VITALS: Per patient if applicable, see vitals. GENERAL: Alert and in no acute distress. CARDIOPULMONARY: No increased WOB. Speaking in clear sentences.  PSYCH: Pleasant and cooperative. Speech normal rate and rhythm. Affect is appropriate. Insight and judgement are appropriate. Attention is focused, linear, and appropriate.  NEURO: Oriented as arrived to appointment on time with no prompting.   Attestation Statements:   This was prepared with the assistance of Engineer, Civil (consulting).  Occasional wrong-word or sound-a-like substitutions may have occurred due to the inherent limitations of voice recognition.   Clayborne Daring, DO    "

## 2024-10-12 ENCOUNTER — Other Ambulatory Visit (HOSPITAL_BASED_OUTPATIENT_CLINIC_OR_DEPARTMENT_OTHER): Payer: Self-pay

## 2024-10-14 ENCOUNTER — Encounter: Payer: Self-pay | Admitting: Nurse Practitioner

## 2024-10-14 ENCOUNTER — Ambulatory Visit: Payer: Self-pay

## 2024-10-14 ENCOUNTER — Other Ambulatory Visit (HOSPITAL_BASED_OUTPATIENT_CLINIC_OR_DEPARTMENT_OTHER): Payer: Self-pay

## 2024-10-14 ENCOUNTER — Ambulatory Visit: Admitting: Nurse Practitioner

## 2024-10-14 VITALS — BP 122/68 | HR 82 | Temp 97.4°F | Wt 319.8 lb

## 2024-10-14 DIAGNOSIS — R059 Cough, unspecified: Secondary | ICD-10-CM | POA: Diagnosis not present

## 2024-10-14 DIAGNOSIS — J069 Acute upper respiratory infection, unspecified: Secondary | ICD-10-CM | POA: Diagnosis not present

## 2024-10-14 LAB — POCT INFLUENZA A/B: Influenza A, POC: NEGATIVE

## 2024-10-14 LAB — POC COVID19 BINAXNOW: SARS Coronavirus 2 Ag: NEGATIVE

## 2024-10-14 MED ORDER — AZITHROMYCIN 250 MG PO TABS
ORAL_TABLET | ORAL | 0 refills | Status: AC
  Filled 2024-10-14: qty 6, 5d supply, fill #0

## 2024-10-14 MED ORDER — BENZONATATE 200 MG PO CAPS
200.0000 mg | ORAL_CAPSULE | Freq: Two times a day (BID) | ORAL | 0 refills | Status: AC | PRN
  Filled 2024-10-14: qty 20, 10d supply, fill #0

## 2024-10-14 NOTE — Telephone Encounter (Signed)
 FYI Only or Action Required?: Action required by provider: request for appointment.  Patient was last seen in primary care on 09/28/2024 by Delores Shields A, DO.  Called Nurse Triage reporting Cough.  Symptoms began several days ago.  Interventions attempted: Rest, hydration, or home remedies.  Symptoms are: gradually worsening. Productive cough,fever, chills,body aches  Triage Disposition: See HCP Within 4 Hours (Or PCP Triage)  Patient/caregiver understands and will follow disposition?: Yes     Copied from CRM #[id number]. Topic: Clinical - Red Word Triage >> Oct 14, 2024  9:30 AM Emylou G wrote: [facility] that prompted transfer to Nurse Triage: sever cough ( yellow/green mucus ) no taste.. chest pains, fever up and down, cold sweats / chills.. Reason for Disposition  [1] MILD difficulty breathing (e.g., minimal/no SOB at rest, SOB with walking, pulse < 100) AND [2] still present when not coughing  Answer Assessment - Initial Assessment Questions 1. ONSET: When did the cough begin?      Saturday 2. SEVERITY: How bad is the cough today?      severe 3. SPUTUM: Describe the color of your sputum (e.g., none, dry cough; clear, white, yellow, green)     Green-yellow 4. HEMOPTYSIS: Are you coughing up any blood? If Yes, ask: How much? (e.g., flecks, streaks, tablespoons, etc.)     no 5. DIFFICULTY BREATHING: Are you having difficulty breathing? If Yes, ask: How bad is it? (e.g., mild, moderate, severe)      Yes, with exertion  6. FEVER: Do you have a fever? If Yes, ask: What is your temperature, how was it measured, and when did it start?     Yes 100 7. CARDIAC HISTORY: Do you have any history of heart disease? (e.g., heart attack, congestive heart failure)      no 8. LUNG HISTORY: Do you have any history of lung disease?  (e.g., pulmonary embolus, asthma, emphysema)     Asthma 9. PE RISK FACTORS: Do you have a history of blood clots? (or: recent major surgery,  recent prolonged travel, bedridden)     no 10. OTHER SYMPTOMS: Do you have any other symptoms? (e.g., runny nose, wheezing, chest pain)       Body aches, chills, chest pain with cough 11. PREGNANCY: Is there any chance you are pregnant? When was your last menstrual period?       no 12. TRAVEL: Have you traveled out of the country in the last month? (e.g., travel history, exposures)       no  Protocols used: Cough - Acute Productive-A-AH

## 2024-10-14 NOTE — Progress Notes (Signed)
 "  Subjective   Patient ID: Jeanette Hernandez, female    DOB: Jan 23, 2001, 24 y.o.   MRN: [id number]  Chief Complaint  Patient presents with   Cough    With green mucus x 3 days. Tried mucinex.    Fever    Monday.   Chills   Muscle Pain    X 1 day.     Referring provider: Delbert Clam, MD  Jeanette Hernandez is a 24 y.o. female with Past Medical History: No date: Asthma No date: Eczema     Comment:  as a child No date: SOBOE (shortness of breath on exertion) No date: Urticaria   HPI   Patient presents today for an acute visit.  Patient has been having URI symptoms since Sunday.  She has been experiencing cough, chest congestion, head congestion, postnasal drip, fever.  Both COVID and flu test in office today were negative.  We will trial azithromycin . Denies f/c/s, n/v/d, hemoptysis, PND, leg swelling Denies chest pain or edema       Allergies[1]  Immunization History  Administered Date(s) Administered   HPV 9-valent 05/24/2011, 09/17/2011, 01/09/2012   Influenza, Seasonal, Injecte, Preservative Fre 08/03/2024   Influenza,inj,Quad PF,6+ Mos 07/13/2020, 08/09/2022   PFIZER(Purple Top)SARS-COV-2 Vaccination 01/08/2020, 02/02/2020   Pfizer Covid-19 Vaccine Bivalent Booster 83yrs & up 06/29/2021    Tobacco History: Tobacco Use History[2] Counseling given: Not Answered   Outpatient Encounter Medications as of 10/14/2024  Medication Sig   albuterol  (PROVENTIL ) (2.5 MG/3ML) 0.083% nebulizer solution Inhale 3 mLs (2.5 mg total) by nebulization every 4 (four) hours as needed for wheezing or shortness of breath.   albuterol  (VENTOLIN  HFA) 108 (90 Base) MCG/ACT inhaler Inhale 2 puffs into the lungs every 6 (six) hours as needed for wheezing or shortness of breath.   azithromycin  (ZITHROMAX ) 250 MG tablet Take 2 tablets on day 1, then 1 tablet daily on days 2 through 5   benzonatate  (TESSALON ) 200 MG capsule Take 1 capsule (200 mg total) by mouth 2 (two) times daily as needed  for cough.   EPINEPHrine  (EPIPEN  2-PAK) 0.3 mg/0.3 mL IJ SOAJ injection Inject 0.3 mg into the muscle as needed for anaphylaxis.   fexofenadine  (ALLEGRA ) 180 MG tablet Take 1 tablet (180 mg total) by mouth daily.   fluticasone  (FLONASE ) 50 MCG/ACT nasal spray Place 1 spray into both nostrils 2 (two) times daily as needed.   montelukast  (SINGULAIR ) 10 MG tablet Take 1 tablet (10 mg total) by mouth at bedtime.   Multiple Vitamins-Minerals (MULTIVITAMIN PO) Take by mouth.   norethindrone -ethinyl estradiol -iron (BLISOVI  FE 1.5/30) 1.5-30 MG-MCG tablet Take 1 tablet by mouth daily.   ondansetron  (ZOFRAN ) 4 MG tablet Take 1 tablet (4 mg total) by mouth every 8 (eight) hours as needed.   semaglutide -weight management (WEGOVY ) 1 MG/0.5ML SOAJ SQ injection Inject 1 mg into the skin once a week.   Vitamin D , Ergocalciferol , (DRISDOL ) 1.25 MG (50000 UNIT) CAPS capsule Take 1 capsule (50,000 Units total) by mouth every 7 (seven) days.   buPROPion  (WELLBUTRIN  SR) 150 MG 12 hr tablet Take 1 tablet (150 mg total) by mouth daily. (Patient not taking: Reported on 10/14/2024)   traMADol  (ULTRAM ) 50 MG tablet Take 1 tablet (50 mg total) by mouth every 6 (six) hours as needed. (Patient not taking: Reported on 10/14/2024)   No facility-administered encounter medications on file as of 10/14/2024.    Review of Systems  Review of Systems  Constitutional:  Positive for fever.  HENT:  Positive  for congestion, postnasal drip, sinus pressure and sinus pain.   Respiratory:  Positive for cough.   Cardiovascular: Negative.   Gastrointestinal: Negative.   Allergic/Immunologic: Negative.   Neurological: Negative.   Psychiatric/Behavioral: Negative.       Objective:   BP 122/68 (BP Location: Left Wrist, Patient Position: Sitting, Cuff Size: Normal)   Pulse 82   Temp (!) 97.4 F (36.3 C) (Temporal)   Wt (!) 319 lb 12.8 oz (145.1 kg)   SpO2 98%   BMI 54.89 kg/m   Wt Readings from Last 5 Encounters:  10/14/24 (!) 319  lb 12.8 oz (145.1 kg)  09/28/24 (!) 311 lb (141.1 kg)  08/31/24 (!) 310 lb (140.6 kg)  08/21/24 (!) 314 lb 4.8 oz (142.6 kg)  08/05/24 (!) 305 lb (138.3 kg)     Physical Exam Vitals and nursing note reviewed.  Constitutional:      General: She is not in acute distress.    Appearance: She is well-developed.  HENT:     Nose: Congestion present.  Cardiovascular:     Rate and Rhythm: Normal rate and regular rhythm.  Pulmonary:     Effort: Pulmonary effort is normal.     Breath sounds: Normal breath sounds.  Neurological:     Mental Status: She is alert and oriented to person, place, and time.       Assessment & Plan:   Cough, unspecified type -     POCT Influenza A/B -     POC COVID-19 BinaxNow  Upper respiratory tract infection, unspecified type -     Azithromycin ; Take 2 tablets on day 1, then 1 tablet daily on days 2 through 5  Dispense: 6 tablet; Refill: 0  Other orders -     Benzonatate ; Take 1 capsule (200 mg total) by mouth 2 (two) times daily as needed for cough.  Dispense: 20 capsule; Refill: 0     Return if symptoms worsen or fail to improve.   Bascom GORMAN Borer, NP 10/14/2024     [1]  Allergies Allergen Reactions   Fish Allergy Anaphylaxis    Such as [place]   Not shellfish   Sulfa Antibiotics Anaphylaxis   Amoxicillin   [2]  Social History Tobacco Use  Smoking Status Never   Passive exposure: Yes  Smokeless Tobacco Never   "

## 2024-10-23 ENCOUNTER — Other Ambulatory Visit (HOSPITAL_BASED_OUTPATIENT_CLINIC_OR_DEPARTMENT_OTHER): Payer: Self-pay

## 2024-10-26 ENCOUNTER — Other Ambulatory Visit (HOSPITAL_BASED_OUTPATIENT_CLINIC_OR_DEPARTMENT_OTHER): Payer: Self-pay

## 2024-10-26 ENCOUNTER — Encounter: Payer: Self-pay | Admitting: Bariatrics

## 2024-10-26 ENCOUNTER — Ambulatory Visit: Admitting: Bariatrics

## 2024-10-26 VITALS — BP 136/84 | HR 90 | Ht 64.0 in | Wt 313.0 lb

## 2024-10-26 DIAGNOSIS — Z6841 Body Mass Index (BMI) 40.0 and over, adult: Secondary | ICD-10-CM | POA: Diagnosis not present

## 2024-10-26 DIAGNOSIS — E88819 Insulin resistance, unspecified: Secondary | ICD-10-CM

## 2024-10-26 DIAGNOSIS — E669 Obesity, unspecified: Secondary | ICD-10-CM

## 2024-10-26 DIAGNOSIS — E559 Vitamin D deficiency, unspecified: Secondary | ICD-10-CM

## 2024-10-26 DIAGNOSIS — R632 Polyphagia: Secondary | ICD-10-CM

## 2024-10-26 MED ORDER — VITAMIN D (ERGOCALCIFEROL) 1.25 MG (50000 UNIT) PO CAPS
50000.0000 [IU] | ORAL_CAPSULE | ORAL | 0 refills | Status: AC
  Filled 2024-10-26 – 2024-11-07 (×2): qty 4, 28d supply, fill #0

## 2024-10-26 MED ORDER — WEGOVY 1 MG/0.5ML ~~LOC~~ SOAJ
1.0000 mg | SUBCUTANEOUS | 0 refills | Status: AC
  Filled 2024-10-26 – 2024-11-07 (×2): qty 2, 28d supply, fill #0

## 2024-10-26 NOTE — Progress Notes (Signed)
 "                                                                                                             WEIGHT SUMMARY AND BIOMETRICS  Weight Lost Since Last Visit: 0  Weight Gained Since Last Visit: 2lb   Vitals BP: 136/84 Pulse Rate: 90 SpO2: 99 %   Anthropometric Measurements Height: 5' 4 (1.626 m) Weight: (!) 313 lb (142 kg) BMI (Calculated): 53.7 Weight at Last Visit: 311lb Weight Lost Since Last Visit: 0 Weight Gained Since Last Visit: 2lb Starting Weight: 326lb Total Weight Loss (lbs): 19 lb (8.618 kg) Peak Weight: 326lb   Body Composition  Body Fat %: 53.4 % Fat Mass (lbs): 167.4 lbs Muscle Mass (lbs): 138.6 lbs Total Body Water (lbs): 104.4 lbs Visceral Fat Rating : 18   Other Clinical Data Fasting: no Labs: no Today's Visit #: 13 Starting Date: 12/16/23    OBESITY Jeanette Hernandez is here to discuss her progress with her obesity treatment plan along with follow-up of her obesity related diagnoses.    Nutrition Plan: the Category 4 plan - 80% adherence.  Current exercise: boxing and walking  Interim History:  She is up 2 lbs since her last visit.  She states that she had the flu and is now getting better.  She states that she has not been getting her protein and secondary to having the flu. She has not been exercising but started back with her boxing this week Eating all of the food on the plan., Protein intake is as prescribed, Is not skipping meals, Journaling consistently., and Water intake is adequate.   Pharmacotherapy: Jeanette Hernandez is on Wegovy  1.0 mg SQ weekly Adverse side effects: None, She is used to the dosage.  Hunger is moderately controlled.  Cravings are moderately controlled.  Assessment/Plan:   Jeanette Hernandez endorses excessive hunger.  Medication(s): Wegovy  Effects of medication:  moderately controlled. Cravings are moderately controlled.   Plan: Medication(s): Wegovy  1.0 mg SQ weekly Will increase water, protein and fiber to  help assuage hunger.  Will minimize foods that have a high glucose index/load to minimize reactive hypoglycemia.  Will resume her exercise both cardio and resistance. Will plan her meals and start cooking more at home. Will get back to her baseline of protein of at least 100 g/day.  Vitamin D  Deficiency Vitamin D  is at goal of 50.  Most recent vitamin D  level was 24.2. She is on  prescription ergocalciferol  50,000 IU weekly. Lab Results  Component Value Date   VD25OH 24.2 (L) 12/16/2023    Plan: Refill prescription vitamin D  50,000 IU weekly.    Generalized Obesity: Current BMI BMI (Calculated): 53.7   Pharmacotherapy Plan Continue and refill  Wegovy  1.0 mg SQ weekly  Jeanette Hernandez is currently in the action stage of change. As such, her goal is to continue with weight loss efforts.  She has agreed to the Category 4 plan.  Exercise goals: All adults should avoid inactivity. Some physical activity is better than none, and adults who participate in any  amount of physical activity gain some health benefits.  Behavioral modification strategies: increasing lean protein intake, decreasing simple carbohydrates , no meal skipping, meal planning , increase water intake, better snacking choices, planning for success, increasing vegetables, increasing lower sugar fruits, increasing fiber rich foods, keep healthy foods in the home, weigh protein portions, measure portion sizes, and mindful eating.  Jeanette Hernandez has agreed to follow-up with our clinic in 4 weeks.    Objective:   VITALS: Per patient if applicable, see vitals. GENERAL: Alert and in no acute distress. CARDIOPULMONARY: No increased WOB. Speaking in clear sentences.  PSYCH: Pleasant and cooperative. Speech normal rate and rhythm. Affect is appropriate. Insight and judgement are appropriate. Attention is focused, linear, and appropriate.  NEURO: Oriented as arrived to appointment on time with no prompting.   Attestation Statements:   This  was prepared with the assistance of Engineer, Civil (consulting).  Occasional wrong-word or sound-a-like substitutions may have occurred due to the inherent limitations of voice recognition.   Jeanette Daring, DO   "

## 2024-11-05 ENCOUNTER — Other Ambulatory Visit (HOSPITAL_COMMUNITY)
Admission: RE | Admit: 2024-11-05 | Discharge: 2024-11-05 | Disposition: A | Source: Ambulatory Visit | Attending: Internal Medicine | Admitting: Internal Medicine

## 2024-11-05 ENCOUNTER — Ambulatory Visit: Payer: Self-pay

## 2024-11-05 ENCOUNTER — Other Ambulatory Visit (HOSPITAL_BASED_OUTPATIENT_CLINIC_OR_DEPARTMENT_OTHER): Payer: Self-pay

## 2024-11-05 ENCOUNTER — Ambulatory Visit: Attending: Internal Medicine | Admitting: Internal Medicine

## 2024-11-05 VITALS — BP 128/76 | HR 84 | Ht 64.0 in | Wt 318.0 lb

## 2024-11-05 DIAGNOSIS — R300 Dysuria: Secondary | ICD-10-CM | POA: Diagnosis not present

## 2024-11-05 DIAGNOSIS — Z113 Encounter for screening for infections with a predominantly sexual mode of transmission: Secondary | ICD-10-CM

## 2024-11-05 DIAGNOSIS — N765 Ulceration of vagina: Secondary | ICD-10-CM

## 2024-11-05 LAB — POCT URINALYSIS DIP (CLINITEK)
Bilirubin, UA: NEGATIVE
Glucose, UA: NEGATIVE mg/dL
Ketones, POC UA: NEGATIVE mg/dL
Nitrite, UA: NEGATIVE
Spec Grav, UA: 1.025
Urobilinogen, UA: 0.2 U/dL
pH, UA: 6.5

## 2024-11-05 NOTE — Telephone Encounter (Signed)
 FYI Only or Action Required?: FYI only for provider: appointment scheduled on 1/29.  Patient was last seen in primary care on 10/26/2024 by Delores Shields A, DO.  Called Nurse Triage reporting Dysuria.  Symptoms began several days ago.  Interventions attempted: Nothing.  Symptoms are: gradually improving.  Triage Disposition: Call PCP Within 24 Hours  Patient/caregiver understands and will follow disposition?: Yes   Reason for Triage: patient has tear at vaginal area , and says it's sore   Reason for Disposition  [1] Painful urination AND [2] EITHER frequency or urgency AND [3] has on-call doctor  Answer Assessment - Initial Assessment Questions 1. SEVERITY: How bad is the pain?  (e.g., Scale 1-10; mild, moderate, or severe)     6/10  2. FREQUENCY: How many times have you had painful urination today?      A little bit, maybe yes when asked if she is urinating more frequently  3. PATTERN: Is pain present every time you urinate or just sometimes?      Only when I pee   4. ONSET: When did the painful urination start?      A few days ago, worse yesterday  5. FEVER: Do you have a fever? If Yes, ask: What is your temperature, how was it measured, and when did it start?     Denies  6. PAST UTI: Have you had a urine infection before? If Yes, ask: When was the last time? and What happened that time?      Denies  7. CAUSE: What do you think is causing the painful urination?  (e.g., UTI, scratch, Herpes sore)     Pt unsure if there is a vaginal tear  8. OTHER SYMPTOMS: Do you have any other symptoms? (e.g., blood in urine, flank pain, genital sores, urgency, vaginal discharge)     Denies  9. PREGNANCY: Is there any chance you are pregnant? When was your last menstrual period?     Pt denies. LMP 09/19/24  Protocols used: Urination Pain - Female-A-AH

## 2024-11-05 NOTE — Telephone Encounter (Signed)
 Noted patient has been scheduled to discuss concerns.

## 2024-11-05 NOTE — Patient Instructions (Signed)
" °  VISIT SUMMARY: During your visit, we discussed your symptoms of a urinary tract infection and a vaginal tear. You reported stinging during urination and discomfort when sitting, which began after protected intercourse. We also addressed your concerns about sexually transmitted infections and your recent back pain.  YOUR PLAN: -VAGINAL TEAR AND ULCERATION, POSSIBLE HERPES SIMPLEX INFECTION: You have a vaginal tear likely due to friction, which is likely causing the stinging during urination. We performed swabs to test for herpes simplex virus, chlamydia, gonorrhea, trichomonas, and vaginal yeast. Your urinalysis showed no urinary tract infection, so the burning is likely from the tear irritation. Until the results of the herpes culture is back, you can do sitz baths in warm water with a little Episom salt daily for the several days. You can also purchase Lidocaine  gel from over the counter and apply to the tear daily to help decrease discomfort. Use just a small amount on the tip of your finger and do not put Lidocaine  inside the vagina. I would avoid vaginal intercourse until cultures are back and the tear/ulcer has healed fully.  -SCREENING FOR SEXUALLY TRANSMITTED INFECTIONS: We conducted blood tests for HIV, syphilis, and hepatitis C as part of your STI screening. Your last screening in October was negative for HIV, and you are vaccinated for hepatitis B.  INSTRUCTIONS: Please follow up with us  once the test results are available. In the meantime, avoid any activities that may irritate the vaginal area and maintain good hygiene. If you experience any worsening symptoms or new concerns, contact our office immediately.    Contains text generated by [employer].   "

## 2024-11-05 NOTE — Progress Notes (Addendum)
 "   Patient ID: Jeanette Hernandez, female    DOB: Dec 11, 2000  MRN: [id number]  CC: Urinary Tract Infection (UTI - Dysuria, frequent urination, burning sensation during urination X4 days/Feels small vaginal tear X4 days - recent sexual intercourse/Requesting blood work for STD/Already received flu vax)   Subjective: Jeanette Hernandez is a 24 y.o. female who presents for UC visit. PCP is Dr. Newlin. Her chronic medical issues include:    Discussed the use of AI scribe software for clinical note transcription with the patient, who gave verbal consent to proceed.  History of Present Illness Jeanette Hernandez is a 24 year old female who presents with symptoms suggestive of a urinary tract infection and a vaginal tear.  She experiences stinging during urination, which began 4 days ago following protected intercourse the day before. She thinks she has  a tear at the bottom of the vaginal opening. Thinks it is due to vaginal dryness during intercourse the day before and the fact that her partner's penis is rather large. She feels uncomfortable when sitting and reports a normal, clear vaginal discharge. No fever or recent bleeding, although there was slight bleeding during intercourse after a period of abstinence. Endorses a little pain in lower back but thinks may be due to sitting at a desk for extended periods at work.  She wants to be tested for sexually transmitted infections even though she uses barrier protention during intercourse consistently. She was last tested in October of the previous year, with negative results for HIV and hepatitis B antibody levels c/w immunity.     Patient Active Problem List   Diagnosis Date Noted   Vitamin D  deficiency 12/17/2023   Adjustment disorder with mixed anxiety and depressed mood 07/08/2023   Alpha thalassemia minor 09/03/2022   Viral upper respiratory infection 01/11/2021   Allergic reaction 11/05/2017   Anaphylactic shock due to adverse food reaction 11/05/2017    Perennial allergic rhinitis 11/05/2017   Mild intermittent asthma 11/05/2017   Allergic urticaria 11/05/2017     Medications Ordered Prior to Encounter[1]  Allergies[2]  Social History   Socioeconomic History   Marital status: Single    Spouse name: Not on file   Number of children: Not on file   Years of education: Not on file   Highest education level: Not on file  Occupational History   Occupation: Student  Tobacco Use   Smoking status: Never    Passive exposure: Yes   Smokeless tobacco: Never  Vaping Use   Vaping status: Never Used  Substance and Sexual Activity   Alcohol use: No    Alcohol/week: 0.0 standard drinks of alcohol   Drug use: No   Sexual activity: Never    Birth control/protection: Abstinence  Other Topics Concern   Not on file  Social History Narrative   Not on file   Social Drivers of Health   Tobacco Use: Medium Risk (10/26/2024)   Patient History    Smoking Tobacco Use: Never    Smokeless Tobacco Use: Never    Passive Exposure: Yes  Financial Resource Strain: Not on file  Food Insecurity: No Food Insecurity (10/14/2024)   Epic    Worried About Programme Researcher, Broadcasting/film/video in the Last Year: Never true    [employer] of Food in the Last Year: Never true  Transportation Needs: No Transportation Needs (10/14/2024)   Epic    Lack of Transportation (Medical): No    Lack of Transportation (Non-Medical): No  Physical Activity: Not  on file  Stress: Not on file  Social Connections: Not on file  Intimate Partner Violence: Not At Risk (10/14/2024)   Epic    Fear of Current or Ex-Partner: No    Emotionally Abused: No    Physically Abused: No    Sexually Abused: No  Depression (PHQ2-9): Low Risk (10/14/2024)   Depression (PHQ2-9)    PHQ-2 Score: 0  Alcohol Screen: Not on file  Housing: Low Risk (10/14/2024)   Epic    Unable to Pay for Housing in the Last Year: No    Number of Times Moved in the Last Year: 0    Homeless in the Last Year: No  Utilities: Not At  Risk (10/14/2024)   Epic    Threatened with loss of utilities: No  Health Literacy: Not on file    Family History  Problem Relation Age of Onset   Asthma Father    Obesity Father     No past surgical history on file.  ROS: Review of Systems Negative except as stated above  PHYSICAL EXAM: BP 128/76 (BP Location: Left Arm, Patient Position: Sitting, Cuff Size: Large)   Pulse 84   Ht 5' 4 (1.626 m)   Wt (!) 318 lb (144.2 kg)   SpO2 100%   BMI 54.58 kg/m   Physical Exam  General appearance - alert, well appearing, and in no distress Mental status - normal mood, behavior, speech, dress, motor activity, and thought processes Pelvic - CMA Clarisa present: Patient noted to have a painful ulcer/tear in the midline of the vaginal opening inferiorly.  It measures about 1 cm in size.  No other lesions noted in the genital area.  Vaginal swab was obtained by me of this area and intravaginal swab also obtained  Results for orders placed or performed in visit on 11/05/24  POCT URINALYSIS DIP (CLINITEK)   Collection Time: 11/05/24  1:32 PM  Result Value Ref Range   Color, UA yellow yellow   Clarity, UA clear clear   Glucose, UA negative negative mg/dL   Bilirubin, UA negative negative   Ketones, POC UA negative negative mg/dL   Spec Grav, UA 8.974 8.989 - 1.025   Blood, UA trace-intact (A) negative   pH, UA 6.5 5.0 - 8.0   POC PROTEIN,UA trace negative, trace   Urobilinogen, UA 0.2 0.2 or 1.0 E.U./dL   Nitrite, UA Negative Negative   Leukocytes, UA Trace (A) Negative       Latest Ref Rng & Units 07/02/2024   11:32 PM 12/16/2023    8:33 AM 05/21/2022    4:30 PM  CMP  Glucose 70 - 99 mg/dL 83  85  83   BUN 6 - 20 mg/dL 8  10  8    Creatinine 0.44 - 1.00 mg/dL 9.18  9.27  9.18   Sodium 135 - 145 mmol/L 138  138  145   Potassium 3.5 - 5.1 mmol/L 3.6  4.4  4.4   Chloride 98 - 111 mmol/L 102  103  109   CO2 22 - 32 mmol/L 22  21  20    Calcium 8.9 - 10.3 mg/dL 9.7  9.4  9.5    Total Protein 6.5 - 8.1 g/dL 7.7  7.0  7.0   Total Bilirubin 0.0 - 1.2 mg/dL 0.6  0.2  <9.7   Alkaline Phos 38 - 126 U/L 72  75  73   AST 15 - 41 U/L 18  14  14    ALT 0 -  44 U/L 19  13  18     Lipid Panel     Component Value Date/Time   CHOL 145 12/16/2023 0833   TRIG 62 12/16/2023 0833   HDL 57 12/16/2023 0833   LDLCALC 75 12/16/2023 0833    CBC    Component Value Date/Time   WBC 8.7 07/02/2024 2332   RBC 5.34 (H) 07/02/2024 2332   HGB 12.5 07/02/2024 2332   HGB 11.6 08/09/2022 1430   HCT 40.2 07/02/2024 2332   HCT 38.1 08/09/2022 1430   PLT 425 (H) 07/02/2024 2332   PLT 424 08/09/2022 1430   MCV 75.3 (L) 07/02/2024 2332   MCV 75 (L) 08/09/2022 1430   MCH 23.4 (L) 07/02/2024 2332   MCHC 31.1 07/02/2024 2332   RDW 16.7 (H) 07/02/2024 2332   RDW 14.5 08/09/2022 1430   LYMPHSABS 2.2 08/09/2022 1430   EOSABS 0.1 08/09/2022 1430   BASOSABS 0.0 08/09/2022 1430    ASSESSMENT AND PLAN: 1. Dysuria (Primary) UA not suggestive of UTI. I suspect the burning is due to the tear/ulcer at the vaginal opening. - POCT URINALYSIS DIP (CLINITEK)  2. Vaginal ulcer Vaginal tear due to rough sexual intercourse versus herpes ulcer Instructions given: Until the results of the herpes culture is back, you can do sitz baths in warm water with a little Episom salt daily for the next several days. You can also purchase Lidocaine  gel from over the counter and apply to the tear daily to help decrease discomfort. Use just a small amount on the tip of your finger and do not put Lidocaine  inside the vagina. I would avoid vaginal intercourse until cultures are back and the tear/ulcer has healed fully. - Herpes simplex virus(hsv) dna by pcr  3. Routine screening for STI (sexually transmitted infection) Continue safe sex practices. - HIV antibody (with reflex) - RPR w/reflex to TrepSure - Hepatitis C Antibody - Cervicovaginal ancillary only  Addendum: RPR reactive but looks like reflex Treponemal  abx not done. Will ask lab to add.  Patient was given the opportunity to ask questions.  Patient verbalized understanding of the plan and was able to repeat key elements of the plan.   This documentation was completed using Paediatric nurse.  Any transcriptional errors are unintentional.  Orders Placed This Encounter  Procedures   HIV antibody (with reflex)   RPR w/reflex to TrepSure   Hepatitis C Antibody   Herpes simplex virus(hsv) dna by pcr   POCT URINALYSIS DIP (CLINITEK)     Requested Prescriptions    No prescriptions requested or ordered in this encounter    No follow-ups on file.  Barnie Louder, MD, FACP     [1]  Current Outpatient Medications on File Prior to Visit  Medication Sig Dispense Refill   albuterol  (PROVENTIL ) (2.5 MG/3ML) 0.083% nebulizer solution Inhale 3 mLs (2.5 mg total) by nebulization every 4 (four) hours as needed for wheezing or shortness of breath. 75 mL 1   albuterol  (VENTOLIN  HFA) 108 (90 Base) MCG/ACT inhaler Inhale 2 puffs into the lungs every 6 (six) hours as needed for wheezing or shortness of breath. 6.7 g 2   EPINEPHrine  (EPIPEN  2-PAK) 0.3 mg/0.3 mL IJ SOAJ injection Inject 0.3 mg into the muscle as needed for anaphylaxis. 2 each 2   fexofenadine  (ALLEGRA ) 180 MG tablet Take 1 tablet (180 mg total) by mouth daily. 30 tablet 11   fluticasone  (FLONASE ) 50 MCG/ACT nasal spray Place 1 spray into both nostrils 2 (two) times daily  as needed. 16 g 11   montelukast  (SINGULAIR ) 10 MG tablet Take 1 tablet (10 mg total) by mouth at bedtime. 30 tablet 11   Multiple Vitamins-Minerals (MULTIVITAMIN PO) Take by mouth.     norethindrone -ethinyl estradiol -iron (BLISOVI  FE 1.5/30) 1.5-30 MG-MCG tablet Take 1 tablet by mouth daily. 30 tablet 11   ondansetron  (ZOFRAN ) 4 MG tablet Take 1 tablet (4 mg total) by mouth every 8 (eight) hours as needed. 30 tablet 0   semaglutide -weight management (WEGOVY ) 1 MG/0.5ML SOAJ SQ injection Inject 1 mg  into the skin once a week. 2 mL 0   Vitamin D , Ergocalciferol , (DRISDOL ) 1.25 MG (50000 UNIT) CAPS capsule Take 1 capsule (50,000 Units total) by mouth every 7 (seven) days. 5 capsule 0   benzonatate  (TESSALON ) 200 MG capsule Take 1 capsule (200 mg total) by mouth 2 (two) times daily as needed for cough. (Patient not taking: Reported on 11/05/2024) 20 capsule 0   buPROPion  (WELLBUTRIN  SR) 150 MG 12 hr tablet Take 1 tablet (150 mg total) by mouth daily. (Patient not taking: Reported on 11/05/2024) 30 tablet 0   traMADol  (ULTRAM ) 50 MG tablet Take 1 tablet (50 mg total) by mouth every 6 (six) hours as needed. (Patient not taking: Reported on 11/05/2024) 15 tablet 0   No current facility-administered medications on file prior to visit.  [2]  Allergies Allergen Reactions   Fish Allergy Anaphylaxis    Such as [place]   Not shellfish   Sulfa Antibiotics Anaphylaxis   Amoxicillin    "

## 2024-11-06 ENCOUNTER — Other Ambulatory Visit (HOSPITAL_BASED_OUTPATIENT_CLINIC_OR_DEPARTMENT_OTHER): Payer: Self-pay

## 2024-11-06 ENCOUNTER — Other Ambulatory Visit: Payer: Self-pay | Admitting: Internal Medicine

## 2024-11-06 LAB — HSV DNA BY PCR (REFERENCE LAB)
HSV 2 DNA: POSITIVE — AB
HSV-1 DNA: NEGATIVE

## 2024-11-06 LAB — CERVICOVAGINAL ANCILLARY ONLY
Candida Glabrata: NEGATIVE
Candida Vaginitis: NEGATIVE
Chlamydia: NEGATIVE
Comment: NEGATIVE
Comment: NEGATIVE
Comment: NEGATIVE
Comment: NEGATIVE
Comment: NORMAL
Neisseria Gonorrhea: NEGATIVE
Trichomonas: NEGATIVE

## 2024-11-06 LAB — HEPATITIS C ANTIBODY: Hep C Virus Ab: NONREACTIVE

## 2024-11-06 LAB — HIV ANTIBODY (ROUTINE TESTING W REFLEX): HIV Screen 4th Generation wRfx: NONREACTIVE

## 2024-11-06 LAB — RPR W/REFLEX TO TREPSURE: RPR: REACTIVE — AB

## 2024-11-06 LAB — RPR, QUANT: RPR, Quant: 1:4 {titer} — ABNORMAL HIGH

## 2024-11-06 MED ORDER — VALACYCLOVIR HCL 1 G PO TABS
1000.0000 mg | ORAL_TABLET | Freq: Two times a day (BID) | ORAL | 0 refills | Status: AC
  Filled 2024-11-06 – 2024-11-07 (×2): qty 20, 10d supply, fill #0

## 2024-11-07 ENCOUNTER — Other Ambulatory Visit (HOSPITAL_BASED_OUTPATIENT_CLINIC_OR_DEPARTMENT_OTHER): Payer: Self-pay

## 2024-11-07 ENCOUNTER — Ambulatory Visit: Payer: Self-pay | Admitting: Internal Medicine

## 2024-11-07 NOTE — Telephone Encounter (Signed)
 Phone call was placed to patient yesterday evening at 6:25 PM to go over the results of recent lab test.  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  Discussed this positive test.  She is shocked because she uses barrier protection consistently during vaginal intercourse.  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  This is the first time that she has experienced an outbreak.  I recommend treatment with Valtrex  to shorten current outbreak.  Advised to abstain from sexual intercourse until we also resolve as most shedding of the virus occurs during active outbreak.  Discussed asymptomatic shedding that can occur and suppressive therapy.  At this time she prefers just to treat the active outbreak and will consider suppressive therapy in the future. RPR was positive.  Will wait for reflex treponemal testing.  No known history of syphilis in the past. HIV negative.  Screen for chlamydia, gonorrhea and trichomonas negative.  Results for orders placed or performed in visit on 11/05/24  Cervicovaginal ancillary only   Collection Time: 11/05/24 11:16 AM  Result Value Ref Range   Neisseria Gonorrhea Negative    Chlamydia Negative    Trichomonas Negative    Candida Vaginitis Negative    Candida Glabrata Negative    Comment Normal Reference Ranger Chlamydia - Negative    Comment      Normal Reference Range Neisseria Gonorrhea - Negative   Comment Normal Reference Range Candida Species - Negative    Comment Normal Reference Range Candida Galbrata - Negative    Comment Normal Reference Range Trichomonas - Negative   HIV antibody (with reflex)   Collection Time: 11/05/24 11:42 AM  Result Value Ref Range   HIV Screen 4th Generation wRfx Non Reactive Non Reactive  RPR w/reflex to TrepSure   Collection Time: 11/05/24 11:42 AM  Result Value Ref Range   RPR Reactive (A) Non Reactive  Hepatitis C Antibody   Collection Time: 11/05/24 11:42 AM  Result  Value Ref Range   Hep C Virus Ab Non Reactive Non Reactive  RPR, Quant   Collection Time: 11/05/24 11:42 AM  Result Value Ref Range   RPR, Quant 1:4 (H) NonRea<1:1 titer  Herpes simplex virus(hsv) dna by pcr   Collection Time: 11/05/24 11:44 AM  Result Value Ref Range   HSV-1 DNA Negative Negative   HSV 2 DNA Positive (A) Negative  POCT URINALYSIS DIP (CLINITEK)   Collection Time: 11/05/24  1:32 PM  Result Value Ref Range   Color, UA yellow yellow   Clarity, UA clear clear   Glucose, UA negative negative mg/dL   Bilirubin, UA negative negative   Ketones, POC UA negative negative mg/dL   Spec Grav, UA 8.974 8.989 - 1.025   Blood, UA trace-intact (A) negative   pH, UA 6.5 5.0 - 8.0   POC PROTEIN,UA trace negative, trace   Urobilinogen, UA 0.2 0.2 or 1.0 E.U./dL   Nitrite, UA Negative Negative   Leukocytes, UA Trace (A) Negative

## 2024-11-08 ENCOUNTER — Other Ambulatory Visit: Payer: Self-pay | Admitting: Internal Medicine

## 2024-11-09 ENCOUNTER — Encounter: Payer: Self-pay | Admitting: Internal Medicine

## 2024-11-10 ENCOUNTER — Telehealth: Payer: Self-pay

## 2024-11-10 ENCOUNTER — Telehealth: Payer: Self-pay | Admitting: Family Medicine

## 2024-11-10 NOTE — Addendum Note (Signed)
 Addended by: VICCI SOBER B on: 11/10/2024 12:24 PM   Modules accepted: Orders

## 2024-11-10 NOTE — Telephone Encounter (Signed)
 Please advise.

## 2024-11-10 NOTE — Telephone Encounter (Signed)
 Copied from CRM #[id number]. Topic: Clinical - Medication Question >> Nov 09, 2024  8:39 AM Donna BRAVO wrote: Reason for CRM: patient has questions about medication valACYclovir  (VALTREX ) 1000 MG tablet    Patient has personal questions and asking to speak with nurse   Patient would like to know if she needs to stay on this medication and if so needs a refill sent in   Patient is going on vacation on 11/19/24

## 2024-11-10 NOTE — Telephone Encounter (Signed)
 Copied from CRM [id number]. Topic: Clinical - Medical Advice >> Nov 10, 2024  9:04 AM   Antwanette L wrote:  Reason for CRM: Joane from the [facility] Dept is calling b/c the pt saw Dr. Vicci on 1/29 and a syphilis test was performed. Joane would like to know if any additional lab orders were placed. Please follow up with Joane at [phone]

## 2024-11-11 ENCOUNTER — Telehealth: Payer: Self-pay | Admitting: Internal Medicine

## 2024-11-11 NOTE — Telephone Encounter (Signed)
 PC was returned to Joseph with the [facility] Department yesterday. He deals with contact tracing for positive syphilis tests. He inquired whether T. Pallidium ab was sent and if not could be added to the blood that was already drawn for this pt. I informed him that I will speak with our lab tesch to make sure it is added if it is not automatically reflex to TPPA testing. He wanted me to call him back to let him know.  I did speak with lab yesterday. Reflex testing was not done so I requested that it be added. Lab tech did call it in to [facility] and I was told results will be back in 24-48 hrs. I called Joane back this a.m and LVMM informing him of this.

## 2024-11-12 LAB — SPECIMEN STATUS REPORT

## 2024-11-12 LAB — T.PALLIDUM AB, TOTAL: Treponema pallidum Antibodies: NONREACTIVE

## 2024-11-13 ENCOUNTER — Ambulatory Visit: Payer: Self-pay | Admitting: Internal Medicine

## 2024-11-30 ENCOUNTER — Ambulatory Visit: Admitting: Bariatrics

## 2024-12-02 ENCOUNTER — Encounter: Payer: Commercial Managed Care - PPO | Admitting: Family Medicine

## 2025-08-25 ENCOUNTER — Ambulatory Visit: Admitting: Allergy
